# Patient Record
Sex: Male | Born: 1945 | Race: Black or African American | Hispanic: No | Marital: Married | Smoking: Never smoker
Health system: Southern US, Community
[De-identification: ages and names within clinical notes are randomized; demographics above are authoritative.]

## PROBLEM LIST (undated history)

## (undated) DIAGNOSIS — Z8611 Personal history of tuberculosis: Secondary | ICD-10-CM

## (undated) DIAGNOSIS — Z972 Presence of dental prosthetic device (complete) (partial): Secondary | ICD-10-CM

## (undated) DIAGNOSIS — R10A1 Flank pain, right side: Secondary | ICD-10-CM

## (undated) DIAGNOSIS — N200 Calculus of kidney: Secondary | ICD-10-CM

## (undated) DIAGNOSIS — Z973 Presence of spectacles and contact lenses: Secondary | ICD-10-CM

## (undated) DIAGNOSIS — I771 Stricture of artery: Secondary | ICD-10-CM

## (undated) DIAGNOSIS — K08109 Complete loss of teeth, unspecified cause, unspecified class: Secondary | ICD-10-CM

## (undated) DIAGNOSIS — Z87440 Personal history of urinary (tract) infections: Secondary | ICD-10-CM

## (undated) DIAGNOSIS — R300 Dysuria: Secondary | ICD-10-CM

## (undated) DIAGNOSIS — Z87442 Personal history of urinary calculi: Secondary | ICD-10-CM

## (undated) DIAGNOSIS — Z87448 Personal history of other diseases of urinary system: Secondary | ICD-10-CM

## (undated) DIAGNOSIS — I7102 Dissection of abdominal aorta: Secondary | ICD-10-CM

## (undated) DIAGNOSIS — E119 Type 2 diabetes mellitus without complications: Secondary | ICD-10-CM

## (undated) DIAGNOSIS — R109 Unspecified abdominal pain: Secondary | ICD-10-CM

---

## 1998-07-07 ENCOUNTER — Encounter: Payer: Self-pay | Admitting: Pulmonary Disease

## 1998-07-07 ENCOUNTER — Ambulatory Visit (HOSPITAL_COMMUNITY): Admission: RE | Admit: 1998-07-07 | Discharge: 1998-07-07 | Payer: Self-pay | Admitting: Pulmonary Disease

## 1999-05-13 ENCOUNTER — Emergency Department (HOSPITAL_COMMUNITY): Admission: EM | Admit: 1999-05-13 | Discharge: 1999-05-13 | Payer: Self-pay | Admitting: Emergency Medicine

## 2002-01-09 ENCOUNTER — Ambulatory Visit (HOSPITAL_COMMUNITY): Admission: RE | Admit: 2002-01-09 | Discharge: 2002-01-09 | Payer: Self-pay | Admitting: Pulmonary Disease

## 2006-10-07 ENCOUNTER — Emergency Department (HOSPITAL_COMMUNITY): Admission: EM | Admit: 2006-10-07 | Discharge: 2006-10-08 | Payer: Self-pay | Admitting: Emergency Medicine

## 2012-03-06 ENCOUNTER — Ambulatory Visit
Admission: RE | Admit: 2012-03-06 | Discharge: 2012-03-06 | Disposition: A | Discharge status: Home / Self Care | Payer: No Typology Code available for payment source | Source: Ambulatory Visit | Attending: Infectious Diseases | Admitting: Infectious Diseases

## 2012-03-06 ENCOUNTER — Other Ambulatory Visit: Payer: Self-pay | Admitting: Infectious Diseases

## 2012-03-06 DIAGNOSIS — A150 Tuberculosis of lung: Secondary | ICD-10-CM

## 2012-03-06 DIAGNOSIS — R634 Abnormal weight loss: Secondary | ICD-10-CM

## 2012-03-06 DIAGNOSIS — R05 Cough: Secondary | ICD-10-CM

## 2012-03-28 ENCOUNTER — Other Ambulatory Visit: Payer: Self-pay | Admitting: Infectious Diseases

## 2012-03-28 ENCOUNTER — Ambulatory Visit
Admission: RE | Admit: 2012-03-28 | Discharge: 2012-03-28 | Disposition: A | Discharge status: Home / Self Care | Payer: No Typology Code available for payment source | Source: Ambulatory Visit | Attending: Infectious Diseases | Admitting: Infectious Diseases

## 2012-03-28 DIAGNOSIS — Z111 Encounter for screening for respiratory tuberculosis: Secondary | ICD-10-CM

## 2012-09-27 HISTORY — PX: EYE SURGERY: SHX253

## 2015-09-01 ENCOUNTER — Other Ambulatory Visit: Payer: Self-pay | Admitting: Urology

## 2015-09-02 ENCOUNTER — Other Ambulatory Visit (HOSPITAL_COMMUNITY): Payer: Self-pay | Admitting: Urology

## 2015-09-02 DIAGNOSIS — N200 Calculus of kidney: Secondary | ICD-10-CM

## 2015-09-15 ENCOUNTER — Other Ambulatory Visit: Payer: Self-pay | Admitting: Radiology

## 2015-09-15 ENCOUNTER — Encounter (HOSPITAL_COMMUNITY): Payer: Self-pay

## 2015-09-15 ENCOUNTER — Encounter (HOSPITAL_COMMUNITY)
Admission: RE | Admit: 2015-09-15 | Discharge: 2015-09-15 | Disposition: A | Discharge status: Home / Self Care | Payer: No Typology Code available for payment source | Source: Ambulatory Visit | Attending: Urology | Admitting: Urology

## 2015-09-15 DIAGNOSIS — N200 Calculus of kidney: Secondary | ICD-10-CM | POA: Diagnosis not present

## 2015-09-15 DIAGNOSIS — K4020 Bilateral inguinal hernia, without obstruction or gangrene, not specified as recurrent: Secondary | ICD-10-CM | POA: Diagnosis not present

## 2015-09-15 DIAGNOSIS — Z87442 Personal history of urinary calculi: Secondary | ICD-10-CM | POA: Diagnosis not present

## 2015-09-15 DIAGNOSIS — E119 Type 2 diabetes mellitus without complications: Secondary | ICD-10-CM | POA: Diagnosis not present

## 2015-09-15 DIAGNOSIS — Z87891 Personal history of nicotine dependence: Secondary | ICD-10-CM | POA: Diagnosis not present

## 2015-09-15 DIAGNOSIS — Z794 Long term (current) use of insulin: Secondary | ICD-10-CM | POA: Diagnosis not present

## 2015-09-15 DIAGNOSIS — R109 Unspecified abdominal pain: Secondary | ICD-10-CM | POA: Diagnosis present

## 2015-09-15 HISTORY — DX: Personal history of urinary calculi: Z87.442

## 2015-09-15 LAB — BASIC METABOLIC PANEL
Anion gap: 6 (ref 5–15)
BUN: 11 mg/dL (ref 6–20)
CHLORIDE: 105 mmol/L (ref 101–111)
CO2: 29 mmol/L (ref 22–32)
CREATININE: 0.89 mg/dL (ref 0.61–1.24)
Calcium: 9.8 mg/dL (ref 8.9–10.3)
GFR calc non Af Amer: >60 mL/min (ref >60)
Glucose, Bld: 188 mg/dL — ABNORMAL HIGH (ref 65–99)
Potassium: 4.4 mmol/L (ref 3.5–5.1)
Sodium: 140 mmol/L (ref 135–145)

## 2015-09-15 LAB — CBC
HCT: 42.2 % (ref 39.0–52.0)
Hemoglobin: 13.8 g/dL (ref 13.0–17.0)
MCH: 27.7 pg (ref 26.0–34.0)
MCHC: 32.7 g/dL (ref 30.0–36.0)
MCV: 84.6 fL (ref 78.0–100.0)
Platelets: 276 10*3/uL (ref 150–400)
RBC: 4.99 MIL/uL (ref 4.22–5.81)
RDW: 13.5 % (ref 11.5–15.5)
WBC: 4.7 10*3/uL (ref 4.0–10.5)

## 2015-09-15 NOTE — Pre-Procedure Instructions (Signed)
EKG done today.

## 2015-09-15 NOTE — Patient Instructions (Addendum)
21 Arthur Reed  09/15/2015   Your procedure is scheduled on:   09-18-2015 Thursday  Enter through [facility]  Entrance and follow signs to Radiology- check in , then  Proceed to  [facility] to 3rd Floor- Short Stay Center. Arrive at     0730   AM..  (Limit 1 person with you).  Call this number if you have problems the morning of surgery: [phone]  Or Presurgical Testing [phone] days before.   For Living Will and/or Health Care Power Attorney Forms: please provide copy for your medical record,may bring AM of surgery(Forms should be already notarized -we do not provide this service).( No, but  information provided  Today per request).  Remember: Follow any bowel prep instructions per MD office.    Do not eat food/ or drink: After Midnight.      Take these medicines the morning of surgery with A SIP OF WATER-   (DO NOT TAKE ANY DIABETIC MEDS AM OF SURGERY) : Tylenol if need. Lantus( 1/2 usual PM dose night before) - No Insulin or diabetic meds AM of.   Do not wear jewelry, make-up or nail polish.  Do not wear deodorant, lotions, powders, or perfumes.   Do not shave legs and under arms- 48 hours(2 days) prior to first CHG shower.(Shaving face and neck okay.)  Do not bring valuables to the hospital.(Hospital is not responsible for lost valuables).  Contacts, dentures or removable bridgework, body piercing, hair pins may not be worn into surgery.  Leave suitcase in the car. After surgery it may be brought to your room.  For patients admitted to the hospital, checkout time is 11:00 AM the day of discharge.(Restricted visitors-Any Persons displaying flu-like symptoms or illness).    Patients discharged the day of surgery will not be allowed to drive home. Must have responsible person with you x 24 hours once discharged.  Name and phone number of your driver:  Arnetta-spouse [phone] cell     Please read over the following fact sheets that you were given:   CHG(Chlorhexidine Gluconate 4% Surgical Soap) use.  Remember : Type/Screen "Blue armbands" - may not be removed once applied(would result in being retested AM of surgery, if removed).         [facility] - Preparing for Surgery Before surgery, you can play an important role.  Because skin is not sterile, your skin needs to be as free of germs as possible.  You can reduce the number of germs on your skin by washing with CHG (chlorahexidine gluconate) soap before surgery.  CHG is an antiseptic cleaner which kills germs and bonds with the skin to continue killing germs even after washing. Please DO NOT use if you have an allergy to CHG or antibacterial soaps.  If your skin becomes reddened/irritated stop using the CHG and inform your nurse when you arrive at Short Stay. Do not shave (including legs and underarms) for at least 48 hours prior to the first CHG shower.  You may shave your face/neck. Please follow these instructions carefully:  1.  Shower with CHG Soap the night before surgery and the  morning of Surgery.  2.  If you choose to wash your hair, wash your hair first as usual with your  normal  shampoo.  3.  After you shampoo, rinse your hair and body thoroughly to remove the  shampoo.  4.  Use CHG as you would any other liquid soap.  You can apply chg directly  to the skin and wash                       Gently with a scrungie or clean washcloth.  5.  Apply the CHG Soap to your body ONLY FROM THE NECK DOWN.   Do not use on face/ open                           Wound or open sores. Avoid contact with eyes, ears mouth and genitals (private parts).                       Wash face,  Genitals (private parts) with your normal soap.             6.  Wash thoroughly, paying special attention to the area where your surgery  will be performed.  7.  Thoroughly rinse your body with warm water from the neck down.  8.  DO NOT shower/wash with your normal soap after using and  rinsing off  the CHG Soap.                9.  Pat yourself dry with a clean towel.            10.  Wear clean pajamas.            11.  Place clean sheets on your bed the night of your first shower and do not  sleep with pets. Day of Surgery : Do not apply any lotions/deodorants the morning of surgery.  Please wear clean clothes to the hospital/surgery center.  FAILURE TO FOLLOW THESE INSTRUCTIONS MAY RESULT IN THE CANCELLATION OF YOUR SURGERY PATIENT SIGNATURE_________________________________  NURSE SIGNATURE__________________________________  ________________________________________________________________________

## 2015-09-16 LAB — HEMOGLOBIN A1C
HEMOGLOBIN A1C: 7.9 % — AB (ref 4.8–5.6)
MEAN PLASMA GLUCOSE: 180 mg/dL

## 2015-09-16 NOTE — Progress Notes (Signed)
09-16-15 1255 Hgb A1C level noted in Epic, please review.

## 2015-09-17 ENCOUNTER — Other Ambulatory Visit: Payer: Self-pay | Admitting: Radiology

## 2015-09-18 ENCOUNTER — Ambulatory Visit (HOSPITAL_COMMUNITY): Payer: No Typology Code available for payment source

## 2015-09-18 ENCOUNTER — Ambulatory Visit (HOSPITAL_COMMUNITY)
Admission: RE | Admit: 2015-09-18 | Discharge: 2015-09-19 | Disposition: A | Discharge status: Home / Self Care | Payer: No Typology Code available for payment source | Source: Ambulatory Visit | Attending: Urology | Admitting: Urology

## 2015-09-18 ENCOUNTER — Encounter (HOSPITAL_COMMUNITY)
Admission: RE | Disposition: A | Discharge status: Home / Self Care | Payer: Self-pay | Source: Ambulatory Visit | Attending: Urology

## 2015-09-18 ENCOUNTER — Ambulatory Visit (HOSPITAL_COMMUNITY)
Admission: RE | Admit: 2015-09-18 | Discharge: 2015-09-18 | Disposition: A | Discharge status: Home / Self Care | Payer: Self-pay | Source: Ambulatory Visit | Attending: Urology | Admitting: Urology

## 2015-09-18 ENCOUNTER — Ambulatory Visit (HOSPITAL_COMMUNITY)
Admission: RE | Admit: 2015-09-18 | Discharge: 2015-09-18 | Disposition: A | Discharge status: Home / Self Care | Payer: Non-veteran care | Source: Ambulatory Visit | Attending: Urology | Admitting: Urology

## 2015-09-18 ENCOUNTER — Ambulatory Visit (HOSPITAL_COMMUNITY): Payer: No Typology Code available for payment source | Admitting: Anesthesiology

## 2015-09-18 ENCOUNTER — Encounter (HOSPITAL_COMMUNITY): Payer: Self-pay | Admitting: *Deleted

## 2015-09-18 DIAGNOSIS — Z87442 Personal history of urinary calculi: Secondary | ICD-10-CM | POA: Insufficient documentation

## 2015-09-18 DIAGNOSIS — Z87891 Personal history of nicotine dependence: Secondary | ICD-10-CM | POA: Insufficient documentation

## 2015-09-18 DIAGNOSIS — Z794 Long term (current) use of insulin: Secondary | ICD-10-CM | POA: Insufficient documentation

## 2015-09-18 DIAGNOSIS — E119 Type 2 diabetes mellitus without complications: Secondary | ICD-10-CM | POA: Diagnosis not present

## 2015-09-18 DIAGNOSIS — N200 Calculus of kidney: Secondary | ICD-10-CM | POA: Diagnosis not present

## 2015-09-18 DIAGNOSIS — K4020 Bilateral inguinal hernia, without obstruction or gangrene, not specified as recurrent: Secondary | ICD-10-CM | POA: Diagnosis not present

## 2015-09-18 HISTORY — PX: NEPHROLITHOTOMY: SHX5134

## 2015-09-18 LAB — CBC WITH DIFFERENTIAL/PLATELET
BASOS PCT: 0 %
Basophils Absolute: 0 10*3/uL (ref 0.0–0.1)
Eosinophils Absolute: 0.1 10*3/uL (ref 0.0–0.7)
Eosinophils Relative: 2 %
HEMATOCRIT: 39.5 % (ref 39.0–52.0)
HEMOGLOBIN: 13 g/dL (ref 13.0–17.0)
LYMPHS ABS: 1.9 10*3/uL (ref 0.7–4.0)
Lymphocytes Relative: 44 %
MCH: 27.8 pg (ref 26.0–34.0)
MCHC: 32.9 g/dL (ref 30.0–36.0)
MCV: 84.6 fL (ref 78.0–100.0)
MONO ABS: 0.3 10*3/uL (ref 0.1–1.0)
MONOS PCT: 7 %
NEUTROS ABS: 2 10*3/uL (ref 1.7–7.7)
Neutrophils Relative %: 47 %
Platelets: 253 10*3/uL (ref 150–400)
RBC: 4.67 MIL/uL (ref 4.22–5.81)
RDW: 13.6 % (ref 11.5–15.5)
WBC: 4.3 10*3/uL (ref 4.0–10.5)

## 2015-09-18 LAB — BASIC METABOLIC PANEL
ANION GAP: 8 (ref 5–15)
BUN: 20 mg/dL (ref 6–20)
CHLORIDE: 105 mmol/L (ref 101–111)
CO2: 27 mmol/L (ref 22–32)
CREATININE: 0.98 mg/dL (ref 0.61–1.24)
Calcium: 9.6 mg/dL (ref 8.9–10.3)
GFR calc Af Amer: >60 mL/min (ref >60)
GFR calc non Af Amer: >60 mL/min (ref >60)
GLUCOSE: 192 mg/dL — AB (ref 65–99)
Potassium: 4.1 mmol/L (ref 3.5–5.1)
Sodium: 140 mmol/L (ref 135–145)

## 2015-09-18 LAB — PROTIME-INR
INR: 1.04 (ref 0.00–1.49)
Prothrombin Time: 13.8 seconds (ref 11.6–15.2)

## 2015-09-18 LAB — GLUCOSE, CAPILLARY
Glucose-Capillary: 177 mg/dL — ABNORMAL HIGH (ref 65–99)
Glucose-Capillary: 173 mg/dL — ABNORMAL HIGH (ref 65–99)

## 2015-09-18 LAB — TYPE AND SCREEN
ABO/RH(D): B POS
ANTIBODY SCREEN: NEGATIVE

## 2015-09-18 LAB — ABO/RH: ABO/RH(D): B POS

## 2015-09-18 LAB — HEMOGLOBIN AND HEMATOCRIT, BLOOD
HCT: 37.6 % — ABNORMAL LOW (ref 39.0–52.0)
Hemoglobin: 12.3 g/dL — ABNORMAL LOW (ref 13.0–17.0)

## 2015-09-18 SURGERY — NEPHROLITHOTOMY PERCUTANEOUS
Anesthesia: General | Site: Flank | Laterality: Right

## 2015-09-18 MED ORDER — CEFAZOLIN SODIUM-DEXTROSE 2-3 GM-% IV SOLR
2.0000 g | INTRAVENOUS | Status: AC
  Administered 2015-09-18: 2 g via INTRAVENOUS

## 2015-09-18 MED ORDER — BELLADONNA ALKALOIDS-OPIUM 16.2-60 MG RE SUPP
1.0000 | Freq: Four times a day (QID) | RECTAL | Status: DC | PRN
  Administered 2015-09-18: 1 via RECTAL
  Filled 2015-09-18: qty 1

## 2015-09-18 MED ORDER — SODIUM CHLORIDE 0.9 % IJ SOLN
INTRAMUSCULAR | Status: AC
  Filled 2015-09-18: qty 10

## 2015-09-18 MED ORDER — ONDANSETRON HCL 4 MG/2ML IJ SOLN
INTRAMUSCULAR | Status: DC | PRN
  Administered 2015-09-18: 4 mg via INTRAVENOUS

## 2015-09-18 MED ORDER — SUGAMMADEX SODIUM 200 MG/2ML IV SOLN
INTRAVENOUS | Status: DC | PRN
  Administered 2015-09-18: 200 mg via INTRAVENOUS

## 2015-09-18 MED ORDER — SUCCINYLCHOLINE CHLORIDE 20 MG/ML IJ SOLN
INTRAMUSCULAR | Status: DC | PRN
  Administered 2015-09-18: 100 mg via INTRAVENOUS

## 2015-09-18 MED ORDER — LIDOCAINE HCL (CARDIAC) 20 MG/ML IV SOLN
INTRAVENOUS | Status: DC | PRN
  Administered 2015-09-18: 100 mg via INTRAVENOUS

## 2015-09-18 MED ORDER — SODIUM CHLORIDE 0.9 % IV SOLN: Freq: Once | INTRAVENOUS | Status: AC

## 2015-09-18 MED ORDER — SODIUM CHLORIDE 0.9 % IV SOLN
INTRAVENOUS | Status: DC
  Administered 2015-09-18: 1000 mL via INTRAVENOUS

## 2015-09-18 MED ORDER — LIDOCAINE HCL (CARDIAC) 20 MG/ML IV SOLN
INTRAVENOUS | Status: AC
  Filled 2015-09-18: qty 5

## 2015-09-18 MED ORDER — SODIUM CHLORIDE 0.9 % IV SOLN
INTRAVENOUS | Status: DC
  Administered 2015-09-18 – 2015-09-19 (×2): via INTRAVENOUS

## 2015-09-18 MED ORDER — CEFAZOLIN SODIUM-DEXTROSE 2-3 GM-% IV SOLR
2.0000 g | Freq: Three times a day (TID) | INTRAVENOUS | Status: AC
  Administered 2015-09-18 – 2015-09-19 (×2): 2 g via INTRAVENOUS
  Filled 2015-09-18 (×2): qty 50

## 2015-09-18 MED ORDER — FENTANYL CITRATE (PF) 100 MCG/2ML IJ SOLN
25.0000 ug | INTRAMUSCULAR | Status: DC | PRN
  Administered 2015-09-18 (×2): 25 ug via INTRAVENOUS

## 2015-09-18 MED ORDER — ONDANSETRON HCL 4 MG/2ML IJ SOLN
INTRAMUSCULAR | Status: AC
  Filled 2015-09-18: qty 2

## 2015-09-18 MED ORDER — LIDOCAINE HCL 1 % IJ SOLN
INTRAMUSCULAR | Status: AC
  Filled 2015-09-18: qty 20

## 2015-09-18 MED ORDER — DEXAMETHASONE SODIUM PHOSPHATE 10 MG/ML IJ SOLN
INTRAMUSCULAR | Status: AC
  Filled 2015-09-18: qty 1

## 2015-09-18 MED ORDER — IOHEXOL 300 MG/ML  SOLN
INTRAMUSCULAR | Status: DC | PRN
  Administered 2015-09-18: 4 mL via URETHRAL

## 2015-09-18 MED ORDER — PROPOFOL 10 MG/ML IV BOLUS
INTRAVENOUS | Status: AC
  Filled 2015-09-18: qty 20

## 2015-09-18 MED ORDER — ROCURONIUM BROMIDE 100 MG/10ML IV SOLN
INTRAVENOUS | Status: AC
  Filled 2015-09-18: qty 1

## 2015-09-18 MED ORDER — CEFAZOLIN SODIUM-DEXTROSE 2-3 GM-% IV SOLR
INTRAVENOUS | Status: AC
  Filled 2015-09-18: qty 50

## 2015-09-18 MED ORDER — SUFENTANIL CITRATE 50 MCG/ML IV SOLN
INTRAVENOUS | Status: AC
  Filled 2015-09-18: qty 1

## 2015-09-18 MED ORDER — PROPOFOL 10 MG/ML IV BOLUS
INTRAVENOUS | Status: DC | PRN
  Administered 2015-09-18: 150 mg via INTRAVENOUS

## 2015-09-18 MED ORDER — ONDANSETRON HCL 4 MG/2ML IJ SOLN: 4.0000 mg | INTRAMUSCULAR | Status: DC | PRN

## 2015-09-18 MED ORDER — HYDROMORPHONE HCL 1 MG/ML IJ SOLN
0.5000 mg | INTRAMUSCULAR | Status: DC | PRN
  Administered 2015-09-18: 0.5 mg via INTRAVENOUS
  Filled 2015-09-18: qty 1

## 2015-09-18 MED ORDER — SUFENTANIL CITRATE 50 MCG/ML IV SOLN
INTRAVENOUS | Status: DC | PRN
  Administered 2015-09-18 (×4): 5 ug via INTRAVENOUS

## 2015-09-18 MED ORDER — SODIUM CHLORIDE 0.9 % IR SOLN
Status: DC | PRN
  Administered 2015-09-18: 18000 mL

## 2015-09-18 MED ORDER — FENTANYL CITRATE (PF) 100 MCG/2ML IJ SOLN
INTRAMUSCULAR | Status: AC
  Filled 2015-09-18: qty 2

## 2015-09-18 MED ORDER — MIDAZOLAM HCL 2 MG/2ML IJ SOLN
INTRAMUSCULAR | Status: AC
  Filled 2015-09-18: qty 6

## 2015-09-18 MED ORDER — ACETAMINOPHEN 325 MG PO TABS: 650.0000 mg | ORAL_TABLET | ORAL | Status: DC | PRN

## 2015-09-18 MED ORDER — MIDAZOLAM HCL 2 MG/2ML IJ SOLN
INTRAMUSCULAR | Status: AC | PRN
  Administered 2015-09-18 (×5): 0.5 mg via INTRAVENOUS
  Administered 2015-09-18: 1 mg via INTRAVENOUS
  Administered 2015-09-18 (×3): 0.5 mg via INTRAVENOUS

## 2015-09-18 MED ORDER — MIDAZOLAM HCL 2 MG/2ML IJ SOLN
INTRAMUSCULAR | Status: AC
  Filled 2015-09-18: qty 2

## 2015-09-18 MED ORDER — ROCURONIUM BROMIDE 100 MG/10ML IV SOLN
INTRAVENOUS | Status: DC | PRN
  Administered 2015-09-18: 10 mg via INTRAVENOUS
  Administered 2015-09-18: 30 mg via INTRAVENOUS

## 2015-09-18 MED ORDER — DEXAMETHASONE SODIUM PHOSPHATE 10 MG/ML IJ SOLN
INTRAMUSCULAR | Status: DC | PRN
  Administered 2015-09-18: 10 mg via INTRAVENOUS

## 2015-09-18 MED ORDER — SUGAMMADEX SODIUM 200 MG/2ML IV SOLN
INTRAVENOUS | Status: AC
  Filled 2015-09-18: qty 2

## 2015-09-18 MED ORDER — EPHEDRINE SULFATE 50 MG/ML IJ SOLN
INTRAMUSCULAR | Status: AC
  Filled 2015-09-18: qty 1

## 2015-09-18 MED ORDER — FENTANYL CITRATE (PF) 100 MCG/2ML IJ SOLN
INTRAMUSCULAR | Status: AC | PRN
  Administered 2015-09-18 (×3): 25 ug via INTRAVENOUS
  Administered 2015-09-18: 50 ug via INTRAVENOUS
  Administered 2015-09-18: 25 ug via INTRAVENOUS

## 2015-09-18 MED ORDER — ONDANSETRON HCL 4 MG/2ML IJ SOLN: 4.0000 mg | Freq: Once | INTRAMUSCULAR | Status: DC | PRN

## 2015-09-18 MED ORDER — IOHEXOL 300 MG/ML  SOLN
100.0000 mL | Freq: Once | INTRAMUSCULAR | Status: AC | PRN
Start: 2015-09-18 — End: 2015-09-18
  Administered 2015-09-18: 100 mL

## 2015-09-18 MED ORDER — FENTANYL CITRATE (PF) 100 MCG/2ML IJ SOLN
INTRAMUSCULAR | Status: AC
  Filled 2015-09-18: qty 4

## 2015-09-18 MED ORDER — LACTATED RINGERS IV SOLN
INTRAVENOUS | Status: DC | PRN
  Administered 2015-09-18 (×3): via INTRAVENOUS

## 2015-09-18 MED ORDER — OXYCODONE HCL 5 MG PO TABS
5.0000 mg | ORAL_TABLET | ORAL | Status: DC | PRN
  Administered 2015-09-19 (×2): 5 mg via ORAL
  Filled 2015-09-18 (×2): qty 1

## 2015-09-18 SURGICAL SUPPLY — 56 items
APL ESCP 34 STRL LF DISP (HEMOSTASIS)
APL SKNCLS STERI-STRIP NONHPOA (GAUZE/BANDAGES/DRESSINGS) ×2
APPLICATOR SURGIFLO ENDO (HEMOSTASIS) IMPLANT
BAG URINE DRAINAGE (UROLOGICAL SUPPLIES) IMPLANT
BASKET ZERO TIP NITINOL 2.4FR (BASKET) IMPLANT
BENZOIN TINCTURE PRP APPL 2/3 (GAUZE/BANDAGES/DRESSINGS) ×4 IMPLANT
BLADE SURG 15 STRL LF DISP TIS (BLADE) ×2 IMPLANT
BLADE SURG 15 STRL SS (BLADE) ×4
BSKT STON RTRVL ZERO TP 2.4FR (BASKET)
CATCHER STONE W/TUBE ADAPTER (UROLOGICAL SUPPLIES) IMPLANT
CATH FOLEY 2W COUNCIL 20FR 5CC (CATHETERS) ×3 IMPLANT
CATH FOLEY 2WAY SLVR  5CC 18FR (CATHETERS) ×2
CATH FOLEY 2WAY SLVR 5CC 18FR (CATHETERS) ×1 IMPLANT
CATH IMAGER II 65CM (CATHETERS) IMPLANT
CATH X-FORCE N30 NEPHROSTOMY (TUBING) ×4 IMPLANT
COVER SURGICAL LIGHT HANDLE (MISCELLANEOUS) ×4 IMPLANT
DRAPE C-ARM 42X120 X-RAY (DRAPES) ×4 IMPLANT
DRAPE LINGEMAN PERC (DRAPES) ×4 IMPLANT
DRAPE SURG IRRIG POUCH 19X23 (DRAPES) ×4 IMPLANT
DRSG PAD ABDOMINAL 8X10 ST (GAUZE/BANDAGES/DRESSINGS) ×8 IMPLANT
DRSG TEGADERM 8X12 (GAUZE/BANDAGES/DRESSINGS) ×6 IMPLANT
FIBER LASER FLEXIVA 1000 (UROLOGICAL SUPPLIES) IMPLANT
FIBER LASER FLEXIVA 200 (UROLOGICAL SUPPLIES) IMPLANT
FIBER LASER FLEXIVA 365 (UROLOGICAL SUPPLIES) IMPLANT
FIBER LASER FLEXIVA 550 (UROLOGICAL SUPPLIES) IMPLANT
FIBER LASER TRAC TIP (UROLOGICAL SUPPLIES) IMPLANT
FLOSEAL 10ML (HEMOSTASIS) IMPLANT
GAUZE SPONGE 4X4 12PLY STRL (GAUZE/BANDAGES/DRESSINGS) ×4 IMPLANT
GLOVE BIOGEL M 8.0 STRL (GLOVE) ×4 IMPLANT
GOWN STRL REUS W/TWL XL LVL3 (GOWN DISPOSABLE) ×4 IMPLANT
GUIDEWIRE AMPLAZ .035X145 (WIRE) ×8 IMPLANT
GUIDEWIRE STR DUAL SENSOR (WIRE) IMPLANT
HOLDER FOLEY CATH W/STRAP (MISCELLANEOUS) ×4 IMPLANT
KIT BASIN OR (CUSTOM PROCEDURE TRAY) ×4 IMPLANT
MANIFOLD NEPTUNE II (INSTRUMENTS) ×4 IMPLANT
NS IRRIG 1000ML POUR BTL (IV SOLUTION) ×4 IMPLANT
PACK CYSTO (CUSTOM PROCEDURE TRAY) ×4 IMPLANT
PROBE LITHOCLAST ULTRA 3.8X403 (UROLOGICAL SUPPLIES) IMPLANT
PROBE PNEUMATIC 1.0MMX570MM (UROLOGICAL SUPPLIES) IMPLANT
SET IRRIG Y TYPE TUR BLADDER L (SET/KITS/TRAYS/PACK) ×4 IMPLANT
SET WARMING FLUID IRRIGATION (MISCELLANEOUS) IMPLANT
SHEATH PEELAWAY SET 9 (SHEATH) ×4 IMPLANT
STENT URET 6FRX24 CONTOUR (STENTS) ×3 IMPLANT
STONE CATCHER W/TUBE ADAPTER (UROLOGICAL SUPPLIES) IMPLANT
SURGIFLO W/THROMBIN 8M KIT (HEMOSTASIS) IMPLANT
SUT MNCRL AB 4-0 PS2 18 (SUTURE) IMPLANT
SUT SILK 2 0 30  PSL (SUTURE)
SUT SILK 2 0 30 PSL (SUTURE) IMPLANT
SUT SILK 2 0 SH (SUTURE) ×3 IMPLANT
SYR 20CC LL (SYRINGE) ×8 IMPLANT
SYRINGE 10CC LL (SYRINGE) ×4 IMPLANT
TOWEL OR NON WOVEN STRL DISP B (DISPOSABLE) ×4 IMPLANT
TRAY FOLEY W/METER SILVER 14FR (SET/KITS/TRAYS/PACK) IMPLANT
TRAY FOLEY W/METER SILVER 16FR (SET/KITS/TRAYS/PACK) IMPLANT
TUBING CONNECTING 10 (TUBING) ×6 IMPLANT
TUBING CONNECTING 10' (TUBING) ×2

## 2015-09-18 NOTE — H&P (Signed)
Chief Complaint: Patient was seen in consultation today for right nephroureteral catheter placement  Referring Physician(s): Ottelin,Mark  History of Present Illness: Arthur Reed is a 69 y.o. male past medical history significant for diabetes, previously treated tuberculosis, and nephrolithiasis. Patient has history of chronic right CVA/right groin discomfort and latest KUB reveals a large stone in the lower pole the right kidney measuring 2.6 x 1.4 cm with a smaller stone just cephalad to this. He presents today for right nephroureteral catheter placement prior to nephrolithotomy.  Past Medical History  Diagnosis Date  . Diabetes mellitus without complication (HCC)   . History of kidney stones     kidney stones x2  . Tuberculosis     tx. for 6 months -no reason given for this.Don't know if Test was Positive or not.  . Complication of anesthesia     12/22 patient states never had problems with anesthesia    Past Surgical History  Procedure Laterality Date  . Eye surgery      laser for eye glaucoma    Allergies: Review of patient's allergies indicates no known allergies.  Medications: Prior to Admission medications   Medication Sig Start Date End Date Taking? Authorizing Provider  acetaminophen (TYLENOL) 325 MG tablet Take 975 mg by mouth 2 (two) times daily as needed for mild pain or moderate pain.    Historical Provider, MD  HYDROcodone-acetaminophen (NORCO/VICODIN) 5-325 MG tablet Take 1-2 tablets by mouth 2 (two) times daily as needed for moderate pain or severe pain.    Historical Provider, MD  ibuprofen (ADVIL,MOTRIN) 600 MG tablet Take 600 mg by mouth every 6 (six) hours as needed for mild pain.    Historical Provider, MD  insulin aspart (NOVOLOG) 100 UNIT/ML injection Inject 8 Units into the skin 3 (three) times daily before meals.    Historical Provider, MD  insulin glargine (LANTUS) 100 UNIT/ML injection Inject 15 Units into the skin at bedtime.     Historical Provider, MD     No family history on file.  Social History   Social History  . Marital Status: Married    Spouse Name: N/A  . Number of Children: N/A  . Years of Education: N/A   Social History Main Topics  . Smoking status: Never Smoker   . Smokeless tobacco: Not on file  . Alcohol Use: 1.2 - 1.8 oz/week    1-2 Cans of beer, 1 Shots of liquor per week     Comment: beer or 1 drink nightly  . Drug Use: No  . Sexual Activity: Not on file   Other Topics Concern  . Not on file   Social History Narrative     Review of Systems  Constitutional: Negative for fever and chills.  Respiratory: Negative for cough and shortness of breath.   Cardiovascular: Negative for chest pain.  Gastrointestinal: Negative for nausea, vomiting, abdominal pain and blood in stool.  Genitourinary: Positive for flank pain. Negative for dysuria and hematuria.  Musculoskeletal: Positive for back pain.  Neurological: Negative for headaches.    Vital Signs: Blood pressure 154/82, temperature 98, heart rate 81, respirations 18, oxygen saturation is 100% room air   Physical Exam  Constitutional: He is oriented to person, place, and time. He appears well-developed and well-nourished.  Cardiovascular: Normal rate and regular rhythm.   Pulmonary/Chest: Effort normal.  Distant breath sounds bilaterally.  Abdominal: Soft. Bowel sounds are normal.  Mild right CVA tenderness; radiation of discomfort to right groin; bilat inguinal hernias  Musculoskeletal: He exhibits no edema.  Neurological: He is alert and oriented to person, place, and time.    Mallampati Score:     Imaging: No results found.  Labs:  CBC:  Recent Labs  09/15/15 1115 09/18/15 0805  WBC 4.7 4.3  HGB 13.8 13.0  HCT 42.2 39.5  PLT 276 253    COAGS:  Recent Labs  09/18/15 0805  INR 1.04    BMP:  Recent Labs  09/15/15 1115 09/18/15 0805  NA 140 140  K 4.4 4.1  CL 105 105  CO2 29 27  GLUCOSE 188*  192*  BUN 11 20  CALCIUM 9.8 9.6  CREATININE 0.89 0.98  GFRNONAA >60 >60  GFRAA >60 >60    LIVER FUNCTION TESTS: No results for input(s): BILITOT, AST, ALT, ALKPHOS, PROT, ALBUMIN in the last 8760 hours.  TUMOR MARKERS: No results for input(s): AFPTM, CEA, CA199, CHROMGRNA in the last 8760 hours.  Assessment and Plan: Patient with history of diabetes and symptomatic nephrolithiasis with largest stone burden in lower pole of right kidney. Plan today is for right nephroureteral catheter placement prior to nephrolithotomy. Risks and benefits discussed with the patient/wife including, but not limited to infection, bleeding, significant bleeding causing loss or decrease in renal function or damage to adjacent structures. All of the patient's questions were answered, patient is agreeable to proceed.Consent signed and in chart.     Thank you for this interesting consult.  I greatly enjoyed meeting [facility] and look forward to participating in their care.  A copy of this report was sent to the requesting provider on this date.  Signed: D. Jeananne Rama 09/18/2015, 8:51 AM   I spent a total of 15 minutes in face to face in clinical consultation, greater than 50% of which was counseling/coordinating care for right nephroureteral catheter placement

## 2015-09-18 NOTE — Transfer of Care (Signed)
Immediate Anesthesia Transfer of Care Note  Patient: Arthur HawthorneClarence Blackley  Procedure(s) Performed: Procedure(s): RIGHT PERCUTANEOUS NEPHROLITHOTOMY  (Right)  Patient Location: PACU  Anesthesia Type:General  Level of Consciousness: awake, pateint uncooperative, confused, lethargic and responds to stimulation  Airway & Oxygen Therapy: Patient Spontanous Breathing and Patient connected to face mask oxygen  Post-op Assessment: Report given to RN, Post -op Vital signs reviewed and stable and Patient moving all extremities X 4  Post vital signs: Reviewed and stable  Last Vitals:  Filed Vitals:   09/18/15 0733  BP: 154/82  Pulse: 81  Temp: 36.7 C  Resp: 18    Complications: No apparent anesthesia complications

## 2015-09-18 NOTE — H&P (Signed)
Reason For Visit Mr. Krichbaum is a 69 year old male who was self-referred for abdominal pain and kidney stone.   History of Present Illness Calculus disease: I saw him initially in 1/08 at which time he was passing right ureteral stone and a CT scan at that time revealed bilateral renal calculi with the largest measuring 10 x 14 mm located in the lower pole right kidney.     CT scan 03/03/15 revealed bilateral, nonobstructing renal calculi as well as a 1.4 cm stone seen within the right renal pelvis. According to the radiologist the stone burden had not changed since 9/14.    Interval history: The patient had reported having right-sided pelvic pain for several years radiating to the right groin region. It appears that he was found to have a 1.6 cm stone in the right renal pelvis and initially scheduled for ESWL but did not have this performed due to a lack of travel pain. He was then seen and scheduled for a right PCNL in 2010 but for some reason did not have this performed. In 6/16 a CT scan at the [facility] revealed bilateral nonobstructing stones which had not changed from a previous CT scan done in 8/14 with what was described as a 1.4 cm right renal pelvic stone. He was having inguinal pain at that time and was found to have bilateral inguinal hernias. I then saw what appears to be a posting sheet to schedule him for a right PCNL in 9/16 at the [place].  He reports to me that his chief complaint is that of pain in his lower abdomen on the right hand side. He has not had his hernias repaired.   Past Medical History Problems  1. History of diabetes mellitus (Z86.39)  Surgical History Problems  1. History of No Surgical Problems  Current Meds 1. Lantus 100 UNIT/ML Subcutaneous Solution;  Therapy: (Recorded:01Dec2016) to Recorded 2. NovoLOG FlexPen 100 UNIT/ML SOLN;  Therapy: (Recorded:01Dec2016) to Recorded  Allergies Medication  1. No Known Drug Allergies  Family History Problems  1.  Family history of Family Health Status Number Of Children   3 sons 2. Family history of Alzheimer's disease (Z82.0) : Mother 3. Family history of diabetes mellitus (Z83.3) : Father  Social History Problems    Alcohol use (Z78.9)   Caffeine use (F15.90)   Father deceased   Former smoker (Z87.891)   Married   Mother deceased   Number of children  Review of Systems Genitourinary, constitutional, skin, eye, otolaryngeal, hematologic/lymphatic, cardiovascular, pulmonary, endocrine, musculoskeletal, gastrointestinal, neurological and psychiatric system(s) were reviewed and pertinent findings if present are noted and are otherwise negative.  Genitourinary: urinary frequency, dysuria, nocturia, incontinence and urinary stream starts and stops.  Respiratory: shortness of breath.  Musculoskeletal: back pain.    Vitals Vital Signs  Height: 5 ft 8 in Weight: 152 lb  BMI Calculated: 23.11 BSA Calculated: 1.82 Blood Pressure: 136 / 70 Heart Rate: 82  Physical Exam Constitutional: Well nourished and well developed . No acute distress.  ENT:. The ears and nose are normal in appearance.  Neck: The appearance of the neck is normal and no neck mass is present.  Pulmonary: No respiratory distress and normal respiratory rhythm and effort.  Cardiovascular: Heart rate and rhythm are normal . No peripheral edema.  Abdomen: The abdomen is soft and nontender. No masses are palpated. No CVA tenderness. No hernias are palpable. No hepatosplenomegaly noted.  Rectal: Rectal exam demonstrates normal sphincter tone, no tenderness and no masses. The prostate has  no nodularity and is not tender. The left seminal vesicle is nonpalpable. The right seminal vesicle is nonpalpable. The perineum is normal on inspection.  Genitourinary: Examination of the penis demonstrates no discharge, no masses, no lesions and a normal meatus. The scrotum is without lesions. The right epididymis is palpably normal and  non-tender. The left epididymis is palpably normal and non-tender. The right testis is non-tender and without masses. The left testis is non-tender and without masses.  Lymphatics: The femoral and inguinal nodes are not enlarged or tender.  Skin: Normal skin turgor, no visible rash and no visible skin lesions.  Neuro/Psych:. Mood and affect are appropriate.    Results/Data Urine  COLOR YELLOW  APPEARANCE CLEAR  SPECIFIC GRAVITY 1.025  pH 5.5  GLUCOSE 2+  BILIRUBIN NEGATIVE  KETONE NEGATIVE  BLOOD 2+  PROTEIN NEGATIVE  NITRITE NEGATIVE  LEUKOCYTE ESTERASE 1+  SQUAMOUS EPITHELIAL/HPF 0-5 HPF WBC 6-10 WBC/HPF RBC 3-10 RBC/HPF BACTERIA NONE SEEN HPF CRYSTALS NONE SEEN HPF CASTS See Below LPF Other   Yeast NONE SEEN HPF  [facility] records or history [facility].  The following images/tracing/specimen were independently visualized:  CT scan as above.  The following clinical lab reports were reviewed:  UA: Red and white cells were noted in his urine today.  The following radiology reports were reviewed: CT scan.    Assessment Assessed  1. Renal calculus, bilateral (N20.0)  I obtained a KUB today and it reveals the largest stone that appears to be in the area of the lower pole of his right kidney measures 2.6 x 1.4 cm. There also appears to be a smaller stone just cephalad to this large stone as well.       We discussed the management of urinary stones. These options include observation, ureteroscopy, shockwave lithotripsy, and PCNL. We discussed which options are relevant to these particular stones. We discussed the natural history of stones as well as the complications of untreated stones and the impact on quality of life without treatment as well as with each of the above listed treatments. We also discussed the efficacy of each treatment in its ability to clear the stone burden. With any of these management options I discussed the signs and symptoms of infection and  the need for emergent treatment should these be experienced. For each option we discussed the ability of each procedure to clear the patient of their stone burden.    For observation I described the risks which include but are not limited to silent renal damage, life-threatening infection, need for emergent surgery, failure to pass stone, and pain.    For ureteroscopy I described the risks which include heart attack, stroke, pulmonary embolus, death, bleeding, infection, damage to contiguous structures, positioning injury, ureteral stricture, ureteral avulsion, ureteral injury, need for ureteral stent, inability to perform ureteroscopy, need for an interval procedure, inability to clear stone burden, stent discomfort and pain.    For shockwave lithotripsy I described the risks which include arrhythmia, kidney contusion, kidney hemorrhage, need for transfusion, long-term risk of diabetes or hypertension, back discomfort, flank ecchymosis, flank abrasion, inability to break up stone, inability to pass stone fragments, Steinstrasse, infection associated with obstructing stones, need for different surgical procedure and possible need for repeat shockwave lithotripsy.    For PCNL I described the risks including heart attack, sure, pulmonary embolus, death, positioning injury, pneumothorax, hydrothorax, need for chest tube, inability to clear stone burden, renal laceration, arterial venous fistula or malformation, need for embolization of  kidney, loss of kidney or renal function, need for repeat procedure, need for prolonged nephrostomy tube, ureteral avulsion and fistula.    Due to the large size of the stone I do not feel lithotripsy would be an appropriate form of therapy and ureteroscopy is also not indicated. We therefore discussed proceeding with a PCNL. He understands that this has a potential be a staged procedure due to the large stone volume. He understands and is elected to proceed. He also  understands that the pain in his abdomen, while may be from his stone, may not resolve as it is somewhat atypical of a nonobstructing renal calculus.   Plan   1. He will be scheduled for a right PCNL.  2.  Preoperative urine culture was negative.

## 2015-09-18 NOTE — Discharge Instructions (Signed)

## 2015-09-18 NOTE — Anesthesia Procedure Notes (Signed)
Procedure Name: Intubation Date/Time: 09/18/2015 1:18 PM Performed by: Leroy LibmanEARDON, Arthur Abram L Patient Re-evaluated:Patient Re-evaluated prior to inductionOxygen Delivery Method: Circle system utilized Preoxygenation: Pre-oxygenation with 100% oxygen Intubation Type: IV induction Ventilation: Mask ventilation without difficulty and Oral airway inserted - appropriate to patient size Laryngoscope Size: Miller and 3 Grade View: Grade I Tube type: Oral Tube size: 8.0 mm Number of attempts: 1 Airway Equipment and Method: Stylet Placement Confirmation: ETT inserted through vocal cords under direct vision,  positive ETCO2 and breath sounds checked- equal and bilateral Secured at: 22 cm Tube secured with: Tape Dental Injury: Teeth and Oropharynx as per pre-operative assessment

## 2015-09-18 NOTE — Op Note (Addendum)
PATIENT:  Arthur Hawthornelarence Housh  PRE-OPERATIVE DIAGNOSIS: Right renal calculi  POST-OPERATIVE DIAGNOSIS: Same  PROCEDURE: 1. Removal of previous percutaneous nephroureteral catheter  2. Percutaneous nephrostomy sheath placement. 3.  Right/left percutaneous nephrolithotomy (2.6 cm cm.). First stage of a staged procedure. 4. Antegrade double-J stent placement. 5. Right nephrostomy tube placement  SURGEON:  Garnett FarmMark C Tehila Sokolow  INDICATION: Arthur HawthorneClarence Gulla is a 69 year old male with a 2.6 cm right lower pole renal calculus with a stone in a lower pole calyx immediately cephalad to the largest stone. He has been having right-sided pain. We've discussed the treatment options and he has elected to proceed with percutaneous nephrolithotomy. Placement of his nephrostomy access by interventional radiology was somewhat difficult with blood loss occurring during that procedure.  ANESTHESIA:  General  EBL:  200 mL  DRAINS: 6 [place]French, 24 double-J stent in the right ureter and a 20 [place] council tip catheter as a nephrostomy tube  LOCAL MEDICATIONS USED:  None  SPECIMEN:  Stone given the patient  Description of procedure: After informed consent the patient was taken to the operating room and administered general endotracheal anesthesia. Once fully anesthetized the patient had an 8618 French Foley catheter placed It was then moved from the stretcher onto the operating room table in a prone position with bony prominences padded and chest pads in place. The flank with exiting nephrostomy catheter was then sterilely prepped and draped in standard fashion. An official timeout was then performed.  Using the existing nephrostomy catheter access I passed a 0.038 inch floppy tipped guidewire down the ureter into the bladder under fluoroscopy.  This was left in place and the nephrostomy catheter was removed.  A transverse incision was made over the guidewire and a peel-away coaxial catheter was then passed over the  guidewire and down the ureter under fluoroscopy.  The inner portion of the coaxial system was then removed and a second guidewire was passed through this catheter and down the ureter into the bladder under fluoroscopy.  The coaxial catheter was then removed and one of the guidewires was secured to the drape as a safety guidewire and the second guidewire was used as a working guidewire.  The NephroMax nephrostomy dilating balloon was then passed over the working guidewire into the area of the renal pelvis under fluoroscopy.  It was then inflated using dilute contrast under fluoroscopy until the balloon was fully inflated.  I then passed the 28 French nephrostomy access sheath over the balloon into the area of the renal pelvis under fluoroscopy and then deflated the balloon and removed the dilating balloon.  The 3026 French rigid nephroscope was then passed under direct vision through the nephrostomy access sheath.  Clotted blood was evacuated and the stone was identified. I used the Music therapistwiss lithoclast to fragment the stone and then used the 2 prong graspers to grasp the fragments of stone and remove these until all visible stone had been completely removed. I then removed the rigid nephroscope.  The flexible cystoscope was passed through the access sheath and I systematically inspected all of the calyces that I was able to access. I was able to advance the scope into the upper pole and no stones were located there. I did find what I thought was a middle pole calyx without any evidence of stones and I could see lower pole calyces but I could not find the smaller stone in the lower pole. It could be seen on fluoroscopy and I used fluoroscopy to help guide my scope to  wear it appeared the stone was but I was unable to identify the stone despite retroflexion of the scope and multiple maneuvers and a great deal of time spent. I therefore removed the flexible cystoscope and reinserted the rigid nephroscope over the  working guidewire.  The stent was passed over the guidewire down the ureter and into the bladder and as I removed the guidewire I noted good curl in the bladder. I placed the stent in a lower pole calyx and then passed a guidewire through the rigid nephroscope and down the ureter. This was left in place and I passed the 20 [place] council tip catheter over the guidewire so that it was located in the area the renal pelvis and filled the balloon with approximately 2 mL of dilute contrast. I then removed the safety and working guidewire and secured the nephrostomy tube to the skin with a figure-of-eight 2-0 silk suture after which sterile 4 x 4's, an ABD and Tegaderm were applied. The catheter, which was serving as a nephrostomy tube, was connected to closed system drainage and the patient was awakened and taken to the recovery room in stable and satisfactory condition. He tolerated the procedure well with no intraoperative complications.  PLAN OF CARE: Discharge to home after an overnight stay.  PATIENT DISPOSITION:  PACU - hemodynamically stable.

## 2015-09-18 NOTE — Progress Notes (Signed)
Night of surgery note  Subjective: The patient is doing well.  He is not having any significant flank pain but was having some suprapubic pain and his catheter stopped draining soit was irrigated and he is feeling better now.  Objective: Vital signs in last 24 hours: Temp:  [97.7 F (36.5 C)-98 F (36.7 C)] 97.7 F (36.5 C) (12/22 1522) Pulse Rate:  [76-95] 91 (12/22 1623) Resp:  [11-26] 21 (12/22 1623) BP: (140-175)/(67-114) 157/83 mmHg (12/22 1615) SpO2:  [89 %-100 %] 99 % (12/22 1623) Weight:  [71.668 kg (158 lb)] 71.668 kg (158 lb) (12/22 0733)  Intake/Output this shift: Total I/O In: 2000 [I.V.:2000] Out: 250 [Urine:250]  Physical Exam:  General: Alert and oriented. Abdomen: Soft, Nondistended. Flank dressing is dry and intact. Nephrostomy tube is not draining but was not expected to have significant drainage. Foley catheter is draining bloody urine.  Lab Results:  Recent Labs  09/18/15 0805 09/18/15 1529  HGB 13.0 12.3*  HCT 39.5 37.6*    Assessment/Plan: His hemoglobin is fine. He did have some clots nd will likely need to have his Foley catheter irrigated p.r.n. 1) Continue to monitor 2) Per orders   Yitzchak Kothari C. Vernie Ammonsttelin, MD  Garnett FarmTTELIN,Trentan Trippe C 09/18/2015, 4:40 PM

## 2015-09-18 NOTE — Progress Notes (Signed)
Hgb. And Hct. Drawn by lab. 

## 2015-09-18 NOTE — Progress Notes (Signed)
Foley catheter stopped draining- Dr. Vernie Ammonsttelin notified- Orders given- Foley catheter irrigated with 30 cc Normal Saline x3- few clots obtained- urine flowing freely now- urine red in color with no clots noted  At this time

## 2015-09-18 NOTE — Procedures (Signed)
Interventional Radiology Procedure Note  Procedure: Right perc nephrostomy with nephrostogram and introduction of ureteral stent  Complications: None  Estimated Blood Loss: 50 mL  Recommendations: To OR for completion PCNL   Signed,  Sterling BigHeath K. McCullough, MD

## 2015-09-18 NOTE — Progress Notes (Signed)
Lab results noted- Hgb. 12.3- Hct. 37.6

## 2015-09-18 NOTE — Anesthesia Preprocedure Evaluation (Addendum)
Anesthesia Evaluation  Patient identified by MRN, date of birth, ID band Patient awake    Reviewed: Allergy & Precautions, NPO status , Patient's Chart, lab work & pertinent test results  Airway Mallampati: III  TM Distance: >3 FB Neck ROM: Full    Dental  (+) Dental Advisory Given, Partial Lower, Partial Upper, Missing   Pulmonary  History of tuberculosis s/p 6 month treatment   Pulmonary exam normal breath sounds clear to auscultation       Cardiovascular Exercise Tolerance: Good (-) hypertension(-) CAD and (-) Past MI negative cardio ROS Normal cardiovascular exam Rhythm:Regular Rate:Normal     Neuro/Psych negative neurological ROS  negative psych ROS   GI/Hepatic negative GI ROS, Neg liver ROS,   Endo/Other  diabetes, Insulin Dependent  Renal/GU Nephrolithiasis     Musculoskeletal negative musculoskeletal ROS (+)   Abdominal   Peds  Hematology negative hematology ROS (+)   Anesthesia Other Findings Day of surgery medications reviewed with the patient.  Reproductive/Obstetrics                         Anesthesia Physical Anesthesia Plan  ASA: II  Anesthesia Plan: General   Post-op Pain Management:    Induction: Intravenous  Airway Management Planned: Oral ETT  Additional Equipment:   Intra-op Plan:   Post-operative Plan: Extubation in OR  Informed Consent: I have reviewed the patients History and Physical, chart, labs and discussed the procedure including the risks, benefits and alternatives for the proposed anesthesia with the patient or authorized representative who has indicated his/her understanding and acceptance.   Dental advisory given  Plan Discussed with: CRNA  Anesthesia Plan Comments: (Risks/benefits of general anesthesia discussed with patient including risk of damage to teeth, lips, gum, and tongue, nausea/vomiting, allergic reactions to medications, and the  possibility of heart attack, stroke and death.  All patient questions answered.  Patient wishes to proceed.  **Patient seen after IR perc nephrostomy tube placement where he received fentanyl and midazolam.**)      Anesthesia Quick Evaluation

## 2015-09-18 NOTE — Anesthesia Postprocedure Evaluation (Signed)
Anesthesia Post Note  Patient: Arthur Reed  Procedure(s) Performed: Procedure(s) (LRB): RIGHT PERCUTANEOUS NEPHROLITHOTOMY  (Right)  Patient location during evaluation: PACU Anesthesia Type: General Level of consciousness: awake and alert Pain management: pain level controlled Vital Signs Assessment: post-procedure vital signs reviewed and stable Respiratory status: spontaneous breathing, nonlabored ventilation, respiratory function stable and patient connected to nasal cannula oxygen Cardiovascular status: blood pressure returned to baseline and stable Postop Assessment: no signs of nausea or vomiting Anesthetic complications: no    Last Vitals:  Filed Vitals:   09/18/15 1532 09/18/15 1545  BP: 165/79 153/88  Pulse: 95 87  Temp:    Resp: 26 14    Last Pain:  Filed Vitals:   09/18/15 1557  PainSc: 0-No pain                 [name], [name]

## 2015-09-19 ENCOUNTER — Encounter (HOSPITAL_COMMUNITY): Payer: Self-pay | Admitting: Urology

## 2015-09-19 DIAGNOSIS — N200 Calculus of kidney: Secondary | ICD-10-CM | POA: Diagnosis not present

## 2015-09-19 LAB — HEMOGLOBIN AND HEMATOCRIT, BLOOD
HCT: 31.9 % — ABNORMAL LOW (ref 39.0–52.0)
Hemoglobin: 10.6 g/dL — ABNORMAL LOW (ref 13.0–17.0)

## 2015-09-19 LAB — GLUCOSE, CAPILLARY: Glucose-Capillary: 192 mg/dL — ABNORMAL HIGH (ref 65–99)

## 2015-09-19 MED ORDER — OXYCODONE HCL 10 MG PO TABS: 10.0000 mg | ORAL_TABLET | ORAL | Status: DC | PRN

## 2015-09-19 MED ORDER — PHENAZOPYRIDINE HCL 200 MG PO TABS: 200.0000 mg | ORAL_TABLET | Freq: Three times a day (TID) | ORAL | Status: DC | PRN

## 2015-09-19 NOTE — Progress Notes (Signed)
Discussed with patient and spouse discharge instructions, both verbalized agreement and understanding.  Patient's IV was discontinued with no complications.  Patient to go down in wheelchair with all belongings to go home in private vehicle. 

## 2015-09-19 NOTE — Anesthesia Postprocedure Evaluation (Signed)
Anesthesia Post Note  Patient: Arthur Reed  Procedure(s) Performed: Procedure(s) (LRB): RIGHT PERCUTANEOUS NEPHROLITHOTOMY  (Right)  Patient location during evaluation: PACU Anesthesia Type: General Level of consciousness: awake and alert Pain management: pain level controlled Vital Signs Assessment: post-procedure vital signs reviewed and stable Respiratory status: spontaneous breathing, nonlabored ventilation, respiratory function stable and patient connected to nasal cannula oxygen Cardiovascular status: blood pressure returned to baseline and stable Postop Assessment: no signs of nausea or vomiting Anesthetic complications: no    Last Vitals:  Filed Vitals:   09/19/15 0155 09/19/15 0559  BP: 140/63 128/55  Pulse: 80 87  Temp: 36.6 C 36.8 C  Resp: 20 20    Last Pain:  Filed Vitals:   09/19/15 0728  PainSc: 5                  Cecile HearingStephen Edward Chrstopher Malenfant

## 2015-09-19 NOTE — Discharge Summary (Signed)
Physician Discharge Summary  Patient ID: Arthur HawthorneClarence Reed MRN: [id number] DOB/AGE: May 28, 1946 69 y.o.  Admit date: 09/18/2015 Discharge date: 09/19/2015  Admission Diagnoses: Right renal calculus  Discharge Diagnoses:  Active Problems:   Renal calculus, right   Discharged Condition: good  Hospital Course: He had a large stone occupying the lower pole calyx that seemed to be causing some discomfort as well.  It had been present for some time.  It had increased in size.  We therefore discussed the treatment options and it was felt the stone size would make it amenable to a percutaneous procedure and he was therefore electively admitted for a right PCNL. He underwent right PCNL after interventional radiology obtained access.  They had some difficulty getting past the stone but eventually were successful.  I was able to visualize the very large stone, fragmented and removed all of it.  Flexible nephroscopy was undertaken but the stone that was seen in an adjacent lower pole calyx could not be visualized.  A stent was placed as well as a nephrostomy tube and he did have some postoperative hematuria that eventually cleared. He is currently having some slight discomfort in the right flank region but is tolerating having his nephrostomy tube clamped.  His catheter has been removed and he is voiding spontaneously.  He has no nausea and is tolerating a diet.  His postoperative CT scan revealed the known stone in the lower pole calyx that appeared to be parallel to the nephrostomy tract remained present but otherwise there was good clearance of the large stone with a single 2 mm fragment remaining.  No significant perinephric hematoma was present.  He is postoperative hemoglobin was down slightly and this morning is down just slightly more but there is no evidence of active bleeding.  This appears to be from blood loss due to his interventional procedure and his PCNL.  A transfusion is not indicated as he  is not tachycardic or orthostatic. His nephrostomy tube was removed without difficulty.  He is felt ready for discharge at this time and was given discharge instructions and a follow-up.  We will discuss retrograde management of the remaining stone at his follow-up visit.    Discharge Exam: Blood pressure 128/55, pulse 87, temperature 98.3 F (36.8 C), temperature source Oral, resp. rate 20, height 5\' 8"  (1.727 m), weight 71.668 kg (158 lb), SpO2 98 %. General appearance: alert, cooperative and no distress Back: nephrostomy tube site without drainage around the tube.  No evidence of inflammation or erythema.  No ecchymoses or swelling.  Disposition: he will be discharged home with a prescription for oxycodone10 mg and Pyridium with follow-up in one week  Discharge Instructions    Discharge patient    Complete by:  As directed             Medication List    TAKE these medications        acetaminophen 325 MG tablet  Commonly known as:  TYLENOL  Take 975 mg by mouth 2 (two) times daily as needed for mild pain or moderate pain.     HYDROcodone-acetaminophen 5-325 MG tablet  Commonly known as:  NORCO/VICODIN  Take 1-2 tablets by mouth 2 (two) times daily as needed for moderate pain or severe pain.     ibuprofen 600 MG tablet  Commonly known as:  ADVIL,MOTRIN  Take 600 mg by mouth every 6 (six) hours as needed for mild pain.     insulin aspart 100 UNIT/ML injection  Commonly known  as:  novoLOG  Inject 8 Units into the skin 3 (three) times daily before meals.     insulin glargine 100 UNIT/ML injection  Commonly known as:  LANTUS  Inject 15 Units into the skin at bedtime.     Oxycodone HCl 10 MG Tabs  Take 1 tablet (10 mg total) by mouth every 4 (four) hours as needed.     phenazopyridine 200 MG tablet  Commonly known as:  PYRIDIUM  Take 1 tablet (200 mg total) by mouth 3 (three) times daily as needed for pain.           Follow-up Information    Follow up with  Garnett Farm, MD On 09/30/2015.   Specialty:  Urology   Why:  For your appiontment at 9:00   Contact information:   [address] [place] [address] [phone]       Signed: Garnett Farm 09/19/2015, 7:10 AM

## 2015-09-28 ENCOUNTER — Emergency Department (HOSPITAL_COMMUNITY): Payer: Non-veteran care

## 2015-09-28 ENCOUNTER — Encounter (HOSPITAL_COMMUNITY): Payer: Self-pay | Admitting: Emergency Medicine

## 2015-09-28 ENCOUNTER — Inpatient Hospital Stay (HOSPITAL_COMMUNITY)
Admission: EM | Admit: 2015-09-28 | Discharge: 2015-09-30 | DRG: 689 | Disposition: A | Discharge status: Home / Self Care | Payer: Non-veteran care | Attending: Internal Medicine | Admitting: Internal Medicine

## 2015-09-28 DIAGNOSIS — N10 Acute tubulo-interstitial nephritis: Secondary | ICD-10-CM | POA: Diagnosis present

## 2015-09-28 DIAGNOSIS — E119 Type 2 diabetes mellitus without complications: Secondary | ICD-10-CM | POA: Diagnosis present

## 2015-09-28 DIAGNOSIS — N12 Tubulo-interstitial nephritis, not specified as acute or chronic: Secondary | ICD-10-CM | POA: Diagnosis not present

## 2015-09-28 DIAGNOSIS — Z794 Long term (current) use of insulin: Secondary | ICD-10-CM | POA: Diagnosis not present

## 2015-09-28 DIAGNOSIS — Z87442 Personal history of urinary calculi: Secondary | ICD-10-CM | POA: Diagnosis not present

## 2015-09-28 DIAGNOSIS — I7102 Dissection of abdominal aorta: Secondary | ICD-10-CM | POA: Diagnosis present

## 2015-09-28 DIAGNOSIS — S37011A Minor contusion of right kidney, initial encounter: Secondary | ICD-10-CM | POA: Diagnosis present

## 2015-09-28 DIAGNOSIS — E1165 Type 2 diabetes mellitus with hyperglycemia: Secondary | ICD-10-CM

## 2015-09-28 DIAGNOSIS — R109 Unspecified abdominal pain: Secondary | ICD-10-CM | POA: Diagnosis not present

## 2015-09-28 DIAGNOSIS — N200 Calculus of kidney: Secondary | ICD-10-CM

## 2015-09-28 DIAGNOSIS — I708 Atherosclerosis of other arteries: Secondary | ICD-10-CM | POA: Diagnosis present

## 2015-09-28 DIAGNOSIS — I771 Stricture of artery: Secondary | ICD-10-CM | POA: Diagnosis present

## 2015-09-28 DIAGNOSIS — N151 Renal and perinephric abscess: Secondary | ICD-10-CM | POA: Insufficient documentation

## 2015-09-28 HISTORY — DX: Stricture of artery: I77.1

## 2015-09-28 HISTORY — DX: Dissection of abdominal aorta: I71.02

## 2015-09-28 LAB — I-STAT CG4 LACTIC ACID, ED
LACTIC ACID, VENOUS: 0.99 mmol/L (ref 0.5–2.0)
LACTIC ACID, VENOUS: 0.8 mmol/L (ref 0.5–2.0)

## 2015-09-28 LAB — URINE MICROSCOPIC-ADD ON: Squamous Epithelial / LPF: NONE SEEN

## 2015-09-28 LAB — URINALYSIS, ROUTINE W REFLEX MICROSCOPIC
Glucose, UA: 100 mg/dL — AB
Ketones, ur: 40 mg/dL — AB
NITRITE: POSITIVE — AB
PROTEIN: 30 mg/dL — AB
SPECIFIC GRAVITY, URINE: 1.046 — AB (ref 1.005–1.030)
pH: 7 (ref 5.0–8.0)

## 2015-09-28 LAB — COMPREHENSIVE METABOLIC PANEL
ALT: 13 U/L — AB (ref 17–63)
ANION GAP: 10 (ref 5–15)
AST: 17 U/L (ref 15–41)
Albumin: 3.7 g/dL (ref 3.5–5.0)
Alkaline Phosphatase: 79 U/L (ref 38–126)
BUN: 12 mg/dL (ref 6–20)
CALCIUM: 9.5 mg/dL (ref 8.9–10.3)
CHLORIDE: 98 mmol/L — AB (ref 101–111)
CO2: 26 mmol/L (ref 22–32)
CREATININE: 1.06 mg/dL (ref 0.61–1.24)
Glucose, Bld: 203 mg/dL — ABNORMAL HIGH (ref 65–99)
Potassium: 4.4 mmol/L (ref 3.5–5.1)
Sodium: 134 mmol/L — ABNORMAL LOW (ref 135–145)
Total Bilirubin: 0.9 mg/dL (ref 0.3–1.2)
Total Protein: 7.6 g/dL (ref 6.5–8.1)

## 2015-09-28 LAB — CBC
HCT: 31.9 % — ABNORMAL LOW (ref 39.0–52.0)
Hemoglobin: 10.6 g/dL — ABNORMAL LOW (ref 13.0–17.0)
MCH: 27.7 pg (ref 26.0–34.0)
MCHC: 33.2 g/dL (ref 30.0–36.0)
MCV: 83.5 fL (ref 78.0–100.0)
PLATELETS: 363 10*3/uL (ref 150–400)
RBC: 3.82 MIL/uL — AB (ref 4.22–5.81)
RDW: 12.7 % (ref 11.5–15.5)
WBC: 17.2 10*3/uL — AB (ref 4.0–10.5)

## 2015-09-28 LAB — BASIC METABOLIC PANEL
Anion gap: 9 (ref 5–15)
BUN: 12 mg/dL (ref 6–20)
CALCIUM: 9.1 mg/dL (ref 8.9–10.3)
CHLORIDE: 95 mmol/L — AB (ref 101–111)
CO2: 27 mmol/L (ref 22–32)
CREATININE: 1.12 mg/dL (ref 0.61–1.24)
Glucose, Bld: 225 mg/dL — ABNORMAL HIGH (ref 65–99)
Potassium: 4.3 mmol/L (ref 3.5–5.1)
SODIUM: 131 mmol/L — AB (ref 135–145)

## 2015-09-28 LAB — DIFFERENTIAL
BASOS ABS: 0 10*3/uL (ref 0.0–0.1)
Basophils Relative: 0 %
EOS ABS: 0 10*3/uL (ref 0.0–0.7)
EOS PCT: 0 %
LYMPHS PCT: 11 %
Lymphs Abs: 1.8 10*3/uL (ref 0.7–4.0)
MONO ABS: 1.5 10*3/uL — AB (ref 0.1–1.0)
Monocytes Relative: 9 %
NEUTROS ABS: 13.3 10*3/uL — AB (ref 1.7–7.7)
NEUTROS PCT: 80 %

## 2015-09-28 MED ORDER — SODIUM CHLORIDE 0.9 % IV SOLN
500.0000 mg | Freq: Two times a day (BID) | INTRAVENOUS | Status: DC
  Administered 2015-09-29 – 2015-09-30 (×2): 500 mg via INTRAVENOUS
  Filled 2015-09-28 (×3): qty 500

## 2015-09-28 MED ORDER — IOHEXOL 300 MG/ML  SOLN
100.0000 mL | Freq: Once | INTRAMUSCULAR | Status: AC | PRN
  Administered 2015-09-28: 100 mL via INTRAVENOUS

## 2015-09-28 MED ORDER — PIPERACILLIN-TAZOBACTAM 3.375 G IVPB 30 MIN
3.3750 g | INTRAVENOUS | Status: AC
  Administered 2015-09-28: 3.375 g via INTRAVENOUS
  Filled 2015-09-28: qty 50

## 2015-09-28 MED ORDER — PIPERACILLIN-TAZOBACTAM 3.375 G IVPB
3.3750 g | Freq: Three times a day (TID) | INTRAVENOUS | Status: DC
  Administered 2015-09-29 – 2015-09-30 (×4): 3.375 g via INTRAVENOUS
  Filled 2015-09-28 (×5): qty 50

## 2015-09-28 MED ORDER — FENTANYL CITRATE (PF) 100 MCG/2ML IJ SOLN
25.0000 ug | Freq: Once | INTRAMUSCULAR | Status: AC
  Administered 2015-09-28: 25 ug via INTRAVENOUS
  Filled 2015-09-28: qty 2

## 2015-09-28 MED ORDER — SODIUM CHLORIDE 0.9 % IV BOLUS (SEPSIS)
1000.0000 mL | Freq: Once | INTRAVENOUS | Status: AC
  Administered 2015-09-28: 1000 mL via INTRAVENOUS

## 2015-09-28 MED ORDER — VANCOMYCIN HCL IN DEXTROSE 1-5 GM/200ML-% IV SOLN
1000.0000 mg | INTRAVENOUS | Status: AC
  Administered 2015-09-28: 1000 mg via INTRAVENOUS
  Filled 2015-09-28: qty 200

## 2015-09-28 NOTE — ED Notes (Signed)
Pt states that he had a kidney stone removed and a stent placed in his kidney on 12/27 but since then has had continued pain and loss of appetite. Dr. Phoebe Perchtelin did the surgery. Alert and oriented.

## 2015-09-28 NOTE — Progress Notes (Signed)
ANTIBIOTIC CONSULT NOTE - INITIAL  Pharmacy Consult for Vancomycin & Zosyn Indication: Sepsis  No Known Allergies  Patient Measurements: Height: 5\' 8"  (172.7 cm) Weight: 140 lb (63.504 kg) IBW/kg (Calculated) : 68.4  Vital Signs: Temp: 100.2 F (37.9 C) (01/01 1811) Temp Source: Oral (01/01 1811) BP: 142/73 mmHg (01/01 2030) Pulse Rate: 99 (01/01 2030) Intake/Output from previous day:   Intake/Output from this shift:    Labs:  Recent Labs  09/28/15 1646  WBC 17.2*  HGB 10.6*  PLT 363  CREATININE 1.12   Estimated Creatinine Clearance: 56.7 mL/min (by C-G formula based on Cr of 1.12). No results for input(s): VANCOTROUGH, VANCOPEAK, VANCORANDOM, GENTTROUGH, GENTPEAK, GENTRANDOM, TOBRATROUGH, TOBRAPEAK, TOBRARND, AMIKACINPEAK, AMIKACINTROU, AMIKACIN in the last 72 hours.   Microbiology: No results found for this or any previous visit (from the past 720 hour(s)).  Medical History: Past Medical History  Diagnosis Date  . Diabetes mellitus without complication (HCC)   . History of kidney stones     kidney stones x2  . Tuberculosis     tx. for 6 months -no reason given for this.Don't know if Test was Positive or not.  . Complication of anesthesia     12/22 patient states never had problems with anesthesia    Medications:  Scheduled:   Infusions:  . piperacillin-tazobactam    . vancomycin     Assessment:  [age] yr male s/p recent kidney stone removal and stent placed on 12/27.  Presents with c/o pain and loss of appetite.  Patient febrile with elevated WBC  Pharmacy consulted to dose Vancomycin and Zosyn for sepsis.  CrCl ~ 56 ml/min   1/1 >>Vanc >> 1/1 >>Zosyn >>    1/1 blood: 1/1 urine:  Trough/Dose change info:  Goal of Therapy:  Vancomycin trough level 15-20 mcg/ml  Plan:  Measure antibiotic drug levels at steady state Follow up culture results   Vancomycin 1gm IV x 1 followed by 500mg  IV q12h  Zosyn 3.375gm IV x 1 over 30 min in ED  followed by 3.375gm IV q8h (each dose infused over 4 hrs)  Cissy Galbreath, Joselyn GlassmanLeann Trefz, PharmD 09/28/2015,9:24 PM

## 2015-09-28 NOTE — ED Notes (Signed)
Patient transported to CT 

## 2015-09-28 NOTE — ED Provider Notes (Signed)
The pt had percutaneous nephrolithotomy done over a week ago, he presents with ongoing pain in the R flank with dec PO intake and now with fevers and weakness.  On exam - the surgical site is clean without redness, drainage but with mild ttp.  abd is soft and NT, mild tachycardia, borderline fever, WBC elevated - se[psis w/u.  Anticipate urinary source.  Medical screening examination/treatment/procedure(s) were conducted as a shared visit with non-physician practitioner(s) and myself.  I personally evaluated the patient during the encounter.  Clinical Impression:   Final diagnoses:  Pyelonephritis         Eber HongBrian Dianna Deshler, MD 10/07/15 1159

## 2015-09-28 NOTE — ED Provider Notes (Signed)
CSN: [id number]     Arrival date & time 09/28/15  1556 History   First MD Initiated Contact with Patient 09/28/15 1806     Chief Complaint  Patient presents with  . Flank Pain  . Post-op Problem   HPI  Arthur Reed is a 70 y.o. M PMH significant for kidney stones, s/p percutaneous nephrolithotomy  12/22 (Dr. Ihor Gully) presenting with right flank pain and right abdominal pain since his procedure. He describes his pain as 10/10 pain scale, constant, initially worse with urination but recently has improved with urination, sharp, non-radiating. He endorses decreased PO intake, weight loss of 7 lbs, and intermittent hematuria. He has been on oxycodone (takes 4-6 per day) and pyridium. He denies fevers, chills, N/V/D.  Past Medical History  Diagnosis Date  . Diabetes mellitus without complication (HCC)   . History of kidney stones     kidney stones x2  . Tuberculosis     tx. for 6 months -no reason given for this.Don't know if Test was Positive or not.  . Complication of anesthesia     12/22 patient states never had problems with anesthesia   Past Surgical History  Procedure Laterality Date  . Eye surgery      laser for eye glaucoma  . Nephrolithotomy Right 09/18/2015    Procedure: RIGHT PERCUTANEOUS NEPHROLITHOTOMY ;  Surgeon: Ihor Gully, MD;  Location: [facility];  Service: Urology;  Laterality: Right;   History reviewed. No pertinent family history. Social History  Substance Use Topics  . Smoking status: Never Smoker   . Smokeless tobacco: None  . Alcohol Use: 1.2 - 1.8 oz/week    1-2 Cans of beer, 1 Shots of liquor per week     Comment: beer or 1 drink nightly    Review of Systems  Ten systems are reviewed and are negative for acute change except as noted in the HPI  Allergies  Review of patient's allergies indicates no known allergies.  Home Medications   Prior to Admission medications   Medication Sig Start Date End Date Taking? Authorizing Provider   acetaminophen (TYLENOL) 325 MG tablet Take 975 mg by mouth 2 (two) times daily as needed for mild pain or moderate pain.   Yes Historical Provider, MD  HYDROcodone-acetaminophen (NORCO/VICODIN) 5-325 MG tablet Take 1-2 tablets by mouth 2 (two) times daily as needed for moderate pain or severe pain.   Yes Historical Provider, MD  insulin aspart (NOVOLOG) 100 UNIT/ML injection Inject 8 Units into the skin 3 (three) times daily before meals.   Yes Historical Provider, MD  insulin glargine (LANTUS) 100 UNIT/ML injection Inject 15 Units into the skin at bedtime.   Yes Historical Provider, MD  Oxycodone HCl 10 MG TABS Take 1 tablet (10 mg total) by mouth every 4 (four) hours as needed. 09/19/15  Yes Ihor Gully, MD  phenazopyridine (PYRIDIUM) 200 MG tablet Take 1 tablet (200 mg total) by mouth 3 (three) times daily as needed for pain. 09/19/15  Yes Ihor Gully, MD   BP 157/82 mmHg  Pulse 103  Temp(Src) 100.2 F (37.9 C) (Oral)  Resp 18  Ht 5\' 8"  (1.727 m)  Wt 63.504 kg  BMI 21.29 kg/m2  SpO2 98% Physical Exam  Constitutional: He appears well-developed and well-nourished. No distress.  HENT:  Head: Normocephalic and atraumatic.  Mouth/Throat: Oropharynx is clear and moist. No oropharyngeal exudate.  Eyes: Conjunctivae are normal. Pupils are equal, round, and reactive to light. Right eye exhibits no discharge. Left eye exhibits  no discharge. No scleral icterus.  Neck: No tracheal deviation present.  Cardiovascular: Regular rhythm, normal heart sounds and intact distal pulses.  Exam reveals no gallop and no friction rub.   No murmur heard. Tachycardia  Pulmonary/Chest: Effort normal and breath sounds normal. No respiratory distress. He has no wheezes. He has no rales. He exhibits no tenderness.  Abdominal: Soft. Bowel sounds are normal. He exhibits no distension and no mass. There is tenderness. There is no rebound and no guarding.  Right flank tenderness and right sided abdominal tenderness.   Musculoskeletal: He exhibits no edema.  Lymphadenopathy:    He has no cervical adenopathy.  Neurological: He is alert. Coordination normal.  Skin: Skin is warm and dry. No rash noted. He is not diaphoretic. No erythema.  Clean right flank surgical site without erythema or drainage.   Psychiatric: He has a normal mood and affect. His behavior is normal.  Nursing note and vitals reviewed.   ED Course  Procedures  Labs Review Labs Reviewed  BASIC METABOLIC PANEL - Abnormal; Notable for the following:    Sodium 131 (*)    Chloride 95 (*)    Glucose, Bld 225 (*)    All other components within normal limits  CBC - Abnormal; Notable for the following:    WBC 17.2 (*)    RBC 3.82 (*)    Hemoglobin 10.6 (*)    HCT 31.9 (*)    All other components within normal limits  URINE CULTURE  URINALYSIS, ROUTINE W REFLEX MICROSCOPIC (NOT AT [facility])   Imaging Review Ct Abdomen Pelvis W Contrast  09/28/2015  CLINICAL DATA:  Patient status post right renal stone removal and stent placement 09/23/2015. Continued pain and loss of appetite. EXAM: CT ABDOMEN AND PELVIS WITH CONTRAST TECHNIQUE: Multidetector CT imaging of the abdomen and pelvis was performed using the standard protocol following bolus administration of intravenous contrast. CONTRAST:  OMNIPAQUE IOHEXOL 300 MG/ML  SOLN COMPARISON:  CT abdomen pelvis 09/18/2015 FINDINGS: Lower chest: Normal heart size. Dependent atelectasis within the lung bases. Small right pleural effusion. Hepatobiliary: Liver is normal in size and contour. No focal hepatic lesion identified. Gallbladder is unremarkable. No intrahepatic or extrahepatic biliary ductal dilatation. Pancreas: Unremarkable Spleen: Unremarkable Adrenals/Urinary Tract: The adrenal glands are normal. There is a new low-attenuation subcapsular fluid collection about the right kidney measuring up to 7.4 x 2.3 cm. This exerts mass effect on the right renal cortex. The kidneys enhance with contrast. A  right-sided double-J nephroureteral stent is in place and appears in appropriate position. There is wall thickening and stranding along the course of the right ureter. There is an 8 mm stone within the interpolar region of the right kidney. There is an additional punctate 2 mm stone within the inferior pole of the right kidney. There is an additional 4 mm stone within the superior pole of the right kidney. Multiple nonobstructing stones are demonstrated within the left kidney with the largest in the inferior pole measuring 8 mm. Stomach/Bowel: No abnormal bowel wall thickening or evidence for bowel obstruction. No free intraperitoneal air. Normal morphology of the stomach. Vascular/Lymphatic: Peripheral calcified atherosclerotic plaque involving the abdominal aorta. Infrarenal abdominal aortic ectasia measuring 2.3 cm. Additionally there is suggestion of a short-segment chronic dissection within the infrarenal abdominal aorta (image 38; series 2). There is significant atherosclerotic plaque at the origin of the right common iliac artery causing marked stenosis (image 49; series 2). Other: Prostate unremarkable. Musculoskeletal: No aggressive or acute appearing osseous lesions.  Lumbar spine degenerative changes. IMPRESSION: Interval development of a large low-attenuation subcapsular fluid collection about the right kidney exerting mass effect on the right renal parenchyma. This may potentially represent a urinoma or seroma, postprocedural in etiology. An evolved hematoma is additional consideration. Double-J right nephroureteral stent is in place and appears in appropriate position. Wall thickening and surrounding stranding about the course of the right ureter is likely postprocedural in etiology. Recommend correlation with urinalysis to exclude the possibility of ascending infectious process. Bilateral nonobstructing nephrolithiasis. Marked atherosclerotic plaque of the right common iliac artery causes severe  stenosis. Infrarenal abdominal aortic ectasia. Short segment infrarenal abdominal dissection, likely chronic given associated calcification. These results were called by telephone at the time of interpretation on 09/28/2015 at 9:18 pm to Dr. Althea GrimmerSAMANTHA Telford Archambeau , who verbally acknowledged these results. Electronically Signed   By: Annia Beltrew  Davis M.D.   On: 09/28/2015 21:30   I have personally reviewed and evaluated these images and lab results as part of my medical decision-making.  EKG Interpretation  Date/Time:  Sunday September 28 2015 20:03:45 EST Ventricular Rate:  101 PR Interval:  124 QRS Duration: 86 QT Interval:  328 QTC Calculation: 425 R Axis:   62 Text Interpretation:  Age not entered, assumed to be  70 years old for purpose of ECG interpretation Sinus tachycardia ST elev, probable normal early repol pattern ED PHYSICIAN INTERPRETATION AVAILABLE IN [facility] Confirmed by TEST, Record ([id number]) on 09/29/2015 6:51:06 AM   MDM   Final diagnoses:  Pyelonephritis   Patient uncomfortable appearing with fever and tachycardia. Sepsis workup ordered, fluids given.  CBC with leukocytosis of 17.2, hgb 10.6. Hyponatremia of 131 and hyperglycemia of 225 on BMP, otherwise unremarkable. EKG with sinus tachycardia and ST elevation, probably normal early repolarization pattern. No STEMI.  CT demonstrates the following acute changes: interval development of a large low-attenuation subcapsular fluid collection about the right kidney exerting mass effect on the right renal parenchyma, which is possibly a urinoma or seroma, postprocedural in etiology. An evolved hematoma is additional consideration. Double-J right nephroureteral stent is in place and appears in appropriate position. Wall thickening and surrounding stranding about the course of the right ureter is likely postprocedural in etiology.  Nitrite positive UA with moderate hgb, small leukocytes, ketones, bili, and glucosuria. Vanc and zosyn started in  ED.  Spoke with urology who advised hospital admission for pyelonephritis.  Patient's PCP is Paulino Doorynthia Earl, [facility]C [facility] but he is looking to switch PCPs because he is frustrated with his care.  Dr. Robb Matarrtiz agrees to admission. Patient feeling better on reassessment. Patient in understanding and agreement with the plan and can be safely admitted.    Melton KrebsSamantha Nicole Kashawna Manzer, PA-C 10/05/15 1952  Eber HongBrian Miller, MD 10/07/15 [phone]

## 2015-09-28 NOTE — ED Notes (Signed)
MD at bedside. 

## 2015-09-29 ENCOUNTER — Inpatient Hospital Stay (HOSPITAL_COMMUNITY): Payer: Non-veteran care

## 2015-09-29 ENCOUNTER — Encounter (HOSPITAL_COMMUNITY): Payer: Self-pay | Admitting: Internal Medicine

## 2015-09-29 DIAGNOSIS — I7102 Dissection of abdominal aorta: Secondary | ICD-10-CM

## 2015-09-29 DIAGNOSIS — I771 Stricture of artery: Secondary | ICD-10-CM

## 2015-09-29 DIAGNOSIS — N12 Tubulo-interstitial nephritis, not specified as acute or chronic: Secondary | ICD-10-CM

## 2015-09-29 DIAGNOSIS — N151 Renal and perinephric abscess: Secondary | ICD-10-CM | POA: Insufficient documentation

## 2015-09-29 DIAGNOSIS — E119 Type 2 diabetes mellitus without complications: Secondary | ICD-10-CM

## 2015-09-29 DIAGNOSIS — E1165 Type 2 diabetes mellitus with hyperglycemia: Secondary | ICD-10-CM

## 2015-09-29 DIAGNOSIS — N10 Acute tubulo-interstitial nephritis: Principal | ICD-10-CM

## 2015-09-29 DIAGNOSIS — N200 Calculus of kidney: Secondary | ICD-10-CM

## 2015-09-29 HISTORY — DX: Dissection of abdominal aorta: I71.02

## 2015-09-29 LAB — CBC WITH DIFFERENTIAL/PLATELET
BASOS ABS: 0 10*3/uL (ref 0.0–0.1)
Basophils Relative: 0 %
EOS PCT: 1 %
Eosinophils Absolute: 0.1 10*3/uL (ref 0.0–0.7)
HEMATOCRIT: 28.5 % — AB (ref 39.0–52.0)
Hemoglobin: 9.3 g/dL — ABNORMAL LOW (ref 13.0–17.0)
LYMPHS ABS: 1.9 10*3/uL (ref 0.7–4.0)
LYMPHS PCT: 14 %
MCH: 27.4 pg (ref 26.0–34.0)
MCHC: 32.6 g/dL (ref 30.0–36.0)
MCV: 84.1 fL (ref 78.0–100.0)
MONO ABS: 1.1 10*3/uL — AB (ref 0.1–1.0)
MONOS PCT: 8 %
NEUTROS ABS: 10.7 10*3/uL — AB (ref 1.7–7.7)
Neutrophils Relative %: 77 %
PLATELETS: 312 10*3/uL (ref 150–400)
RBC: 3.39 MIL/uL — ABNORMAL LOW (ref 4.22–5.81)
RDW: 12.7 % (ref 11.5–15.5)
WBC: 13.8 10*3/uL — ABNORMAL HIGH (ref 4.0–10.5)

## 2015-09-29 LAB — COMPREHENSIVE METABOLIC PANEL
ALT: 11 U/L — ABNORMAL LOW (ref 17–63)
AST: 14 U/L — AB (ref 15–41)
Albumin: 3 g/dL — ABNORMAL LOW (ref 3.5–5.0)
Alkaline Phosphatase: 71 U/L (ref 38–126)
Anion gap: 9 (ref 5–15)
BILIRUBIN TOTAL: 1.1 mg/dL (ref 0.3–1.2)
BUN: 11 mg/dL (ref 6–20)
CHLORIDE: 98 mmol/L — AB (ref 101–111)
CO2: 27 mmol/L (ref 22–32)
CREATININE: 1.1 mg/dL (ref 0.61–1.24)
Calcium: 8.6 mg/dL — ABNORMAL LOW (ref 8.9–10.3)
Glucose, Bld: 171 mg/dL — ABNORMAL HIGH (ref 65–99)
POTASSIUM: 4 mmol/L (ref 3.5–5.1)
Sodium: 134 mmol/L — ABNORMAL LOW (ref 135–145)
TOTAL PROTEIN: 6.4 g/dL — AB (ref 6.5–8.1)

## 2015-09-29 LAB — GLUCOSE, CAPILLARY
GLUCOSE-CAPILLARY: 131 mg/dL — AB (ref 65–99)
GLUCOSE-CAPILLARY: 161 mg/dL — AB (ref 65–99)
GLUCOSE-CAPILLARY: 170 mg/dL — AB (ref 65–99)
GLUCOSE-CAPILLARY: 206 mg/dL — AB (ref 65–99)
Glucose-Capillary: 185 mg/dL — ABNORMAL HIGH (ref 65–99)

## 2015-09-29 LAB — MAGNESIUM: Magnesium: 2.1 mg/dL (ref 1.7–2.4)

## 2015-09-29 LAB — PHOSPHORUS: PHOSPHORUS: 2.1 mg/dL — AB (ref 2.5–4.6)

## 2015-09-29 MED ORDER — TAMSULOSIN HCL 0.4 MG PO CAPS
0.4000 mg | ORAL_CAPSULE | Freq: Every day | ORAL | Status: DC
  Administered 2015-09-29 – 2015-09-30 (×2): 0.4 mg via ORAL
  Filled 2015-09-29 (×2): qty 1

## 2015-09-29 MED ORDER — INSULIN ASPART 100 UNIT/ML ~~LOC~~ SOLN
0.0000 [IU] | Freq: Three times a day (TID) | SUBCUTANEOUS | Status: DC
  Administered 2015-09-29 (×2): 4 [IU] via SUBCUTANEOUS
  Administered 2015-09-29 – 2015-09-30 (×2): 3 [IU] via SUBCUTANEOUS

## 2015-09-29 MED ORDER — PHENAZOPYRIDINE HCL 200 MG PO TABS
200.0000 mg | ORAL_TABLET | Freq: Three times a day (TID) | ORAL | Status: DC
  Administered 2015-09-29 – 2015-09-30 (×5): 200 mg via ORAL
  Filled 2015-09-29 (×7): qty 1

## 2015-09-29 MED ORDER — OXYCODONE HCL 5 MG PO TABS
10.0000 mg | ORAL_TABLET | ORAL | Status: DC | PRN
  Administered 2015-09-29 – 2015-09-30 (×2): 10 mg via ORAL
  Filled 2015-09-29 (×2): qty 2

## 2015-09-29 MED ORDER — SODIUM CHLORIDE 0.9 % IV SOLN
INTRAVENOUS | Status: DC
  Administered 2015-09-29 – 2015-09-30 (×2): via INTRAVENOUS

## 2015-09-29 MED ORDER — ENSURE ENLIVE PO LIQD
237.0000 mL | Freq: Two times a day (BID) | ORAL | Status: DC
  Administered 2015-09-29 – 2015-09-30 (×2): 237 mL via ORAL

## 2015-09-29 MED ORDER — SODIUM CHLORIDE 0.9 % IJ SOLN
3.0000 mL | Freq: Two times a day (BID) | INTRAMUSCULAR | Status: DC
  Administered 2015-09-30: 3 mL via INTRAVENOUS

## 2015-09-29 MED ORDER — KETOROLAC TROMETHAMINE 15 MG/ML IJ SOLN
15.0000 mg | Freq: Once | INTRAMUSCULAR | Status: AC
  Administered 2015-09-29: 15 mg via INTRAVENOUS
  Filled 2015-09-29: qty 1

## 2015-09-29 MED ORDER — ONDANSETRON HCL 4 MG PO TABS: 4.0000 mg | ORAL_TABLET | Freq: Four times a day (QID) | ORAL | Status: DC | PRN

## 2015-09-29 MED ORDER — HYDROMORPHONE HCL 1 MG/ML IJ SOLN: 1.0000 mg | INTRAMUSCULAR | Status: DC | PRN

## 2015-09-29 MED ORDER — ONDANSETRON HCL 4 MG/2ML IJ SOLN: 4.0000 mg | Freq: Four times a day (QID) | INTRAMUSCULAR | Status: DC | PRN

## 2015-09-29 MED ORDER — PANTOPRAZOLE SODIUM 40 MG PO TBEC
40.0000 mg | DELAYED_RELEASE_TABLET | Freq: Every day | ORAL | Status: DC
  Administered 2015-09-29 – 2015-09-30 (×3): 40 mg via ORAL
  Filled 2015-09-29 (×3): qty 1

## 2015-09-29 MED ORDER — INSULIN GLARGINE 100 UNIT/ML ~~LOC~~ SOLN
15.0000 [IU] | Freq: Every day | SUBCUTANEOUS | Status: DC
  Administered 2015-09-29 – 2015-09-30 (×2): 15 [IU] via SUBCUTANEOUS
  Filled 2015-09-29 (×2): qty 0.15

## 2015-09-29 NOTE — Progress Notes (Signed)
This shift pt arrived to unit via stretcher room 1505. VS taken, pt oriented to room and call bell with no complications. Family at bedside. Gait steady, general weakness alert and oriented x4. Pain 2/10.patient guide at the bedside. Initial assessment completed and  Will continue to monitor pt throughout shift.

## 2015-09-29 NOTE — Progress Notes (Signed)
Progress Note   Arthur Reed Hearst JYN:[id number] DOB: 09/16/1946 DOA: 09/28/2015 PCP: No PCP Per Patient   Brief Narrative:   Arthur Reed Ilyas is an 70 y.o. male with a PMH of type 2 diabetes, urolithiasis, s/p percutaneous nephrolithotomy on 09/18/15 by Dr. Ihor GullyMark Ottelin who was admitted 09/28/15 with complaints of right flank pain, dysuria, intermittent hematuria, chills, fatigue, suprapubic discomfort and decreased appetite. When seen in the emergency department, the patient stated that he was feeling better. The case was discussed by the emergency department medical staff with urology who recommended admission and treatment for acute pyelonephritis  Assessment/Plan:   Principal Problem:  Acute pyelonephritis in the setting of nephrolithiasis and recent instrumentation with right double J stent - Currently on Vancomycin/Zosyn. - F/U urine culture. - Urologist to consult. - Continue Pyridium.  Active Problems:   Dissection of abdominal aorta (HCC), short-segment, chronic, infrarenal/Right iliac artery stenosis - Needs good BP and lipid control.   DM type 2 (diabetes mellitus, type 2) (HCC) - Currently being managed with Lantus 15 units daily and insulin resistant SSI TID.  - CBGs pending.  A.M. Glucose 171.    DVT Prophylaxis - SCDs ordered.   Family Communication/Anticipated D/C date and plan/Code Status   Family Communication: Wife at the bedside. Disposition Plan: Home when cultures back and afebrile for at least 24 hours. Anticipated D/C date:   2-3 days. Code Status: Full code.   IV Access:    Peripheral IV   Procedures and diagnostic studies:   Ct Abdomen Pelvis W Contrast  09/28/2015  CLINICAL DATA:  Patient status post right renal stone removal and stent placement 09/23/2015. Continued pain and loss of appetite. EXAM: CT ABDOMEN AND PELVIS WITH CONTRAST TECHNIQUE: Multidetector CT imaging of the abdomen and pelvis was performed using the standard  protocol following bolus administration of intravenous contrast. CONTRAST:  100mL OMNIPAQUE IOHEXOL 300 MG/ML  SOLN COMPARISON:  CT abdomen pelvis 09/18/2015 FINDINGS: Lower chest: Normal heart size. Dependent atelectasis within the lung bases. Small right pleural effusion. Hepatobiliary: Liver is normal in size and contour. No focal hepatic lesion identified. Gallbladder is unremarkable. No intrahepatic or extrahepatic biliary ductal dilatation. Pancreas: Unremarkable Spleen: Unremarkable Adrenals/Urinary Tract: The adrenal glands are normal. There is a new low-attenuation subcapsular fluid collection about the right kidney measuring up to 7.4 x 2.3 cm. This exerts mass effect on the right renal cortex. The kidneys enhance with contrast. A right-sided double-J nephroureteral stent is in place and appears in appropriate position. There is wall thickening and stranding along the course of the right ureter. There is an 8 mm stone within the interpolar region of the right kidney. There is an additional punctate 2 mm stone within the inferior pole of the right kidney. There is an additional 4 mm stone within the superior pole of the right kidney. Multiple nonobstructing stones are demonstrated within the left kidney with the largest in the inferior pole measuring 8 mm. Stomach/Bowel: No abnormal bowel wall thickening or evidence for bowel obstruction. No free intraperitoneal air. Normal morphology of the stomach. Vascular/Lymphatic: Peripheral calcified atherosclerotic plaque involving the abdominal aorta. Infrarenal abdominal aortic ectasia measuring 2.3 cm. Additionally there is suggestion of a short-segment chronic dissection within the infrarenal abdominal aorta (image 38; series 2). There is significant atherosclerotic plaque at the origin of the right common iliac artery causing marked stenosis (image 49; series 2). Other: Prostate unremarkable. Musculoskeletal: No aggressive or acute appearing osseous lesions.  Lumbar spine degenerative changes. IMPRESSION: Interval development  of a large low-attenuation subcapsular fluid collection about the right kidney exerting mass effect on the right renal parenchyma. This may potentially represent a urinoma or seroma, postprocedural in etiology. An evolved hematoma is additional consideration. Double-J right nephroureteral stent is in place and appears in appropriate position. Wall thickening and surrounding stranding about the course of the right ureter is likely postprocedural in etiology. Recommend correlation with urinalysis to exclude the possibility of ascending infectious process. Bilateral nonobstructing nephrolithiasis. Marked atherosclerotic plaque of the right common iliac artery causes severe stenosis. Infrarenal abdominal aortic ectasia. Short segment infrarenal abdominal dissection, likely chronic given associated calcification. These results were called by telephone at the time of interpretation on 09/28/2015 at 9:18 pm to Dr. Althea Grimmer , who verbally acknowledged these results. Electronically Signed   By: Annia Belt M.D.   On: 09/28/2015 21:30     Medical Consultants:    None.  Anti-Infectives:   Vancomycin 09/29/15---> Zosyn 09/29/15--->  Subjective:   Arthur Hawthorne had some sweats in the night.  No N/V.  No abdominal or back pain.    Objective:    Filed Vitals:   09/28/15 2315 09/28/15 2330 09/29/15 0009 09/29/15 0542  BP: 145/76 139/76 133/64 122/58  Pulse: 93 92 93 70  Temp:   98.8 F (37.1 C) 98.1 F (36.7 C)  TempSrc:   Oral Oral  Resp: 24 24 20 21   Height:   5\' 8"  (1.727 m)   Weight:   65.545 kg (144 lb 8 oz)   SpO2: 96% 96% 98% 98%    Intake/Output Summary (Last 24 hours) at 09/29/15 0746 Last data filed at 09/29/15 0345  Gross per 24 hour  Intake    120 ml  Output    450 ml  Net   -330 ml   Filed Weights   09/28/15 1811 09/29/15 0009  Weight: 63.504 kg (140 lb) 65.545 kg (144 lb 8 oz)    Exam: Gen:   NAD Cardiovascular:  RRR, No M/R/G Respiratory:  Lungs CTAB Gastrointestinal:  Abdomen soft, NT/ND, + BS Extremities:  No C/E/C   Data Reviewed:    Labs: Basic Metabolic Panel:  Recent Labs Lab 09/28/15 1646 09/28/15 2009 09/29/15 0521 09/29/15 0527  NA 131* 134*  --  134*  K 4.3 4.4  --  4.0  CL 95* 98*  --  98*  CO2 27 26  --  27  GLUCOSE 225* 203*  --  171*  BUN 12 12  --  11  CREATININE 1.12 1.06  --  1.10  CALCIUM 9.1 9.5  --  8.6*  MG  --   --  2.1  --   PHOS  --   --  2.1*  --    GFR Estimated Creatinine Clearance: 59.5 mL/min (by C-G formula based on Cr of 1.1). Liver Function Tests:  Recent Labs Lab 09/28/15 2009 09/29/15 0527  AST 17 14*  ALT 13* 11*  ALKPHOS 79 71  BILITOT 0.9 1.1  PROT 7.6 6.4*  ALBUMIN 3.7 3.0*   CBC:  Recent Labs Lab 09/28/15 1646 09/29/15 0527  WBC 17.2* 13.8*  NEUTROABS 13.3* 10.7*  HGB 10.6* 9.3*  HCT 31.9* 28.5*  MCV 83.5 84.1  PLT 363 312   CBG:  Recent Labs Lab 09/29/15 0040  GLUCAP 170*   Sepsis Labs:  Recent Labs Lab 09/28/15 1646 09/28/15 2035 09/28/15 2259 09/29/15 0527  WBC 17.2*  --   --  13.8*  LATICACIDVEN  --  0.99 0.80  --  Microbiology No results found for this or any previous visit (from the past 240 hour(s)).   Medications:   . feeding supplement (ENSURE ENLIVE)  237 mL Oral BID BM  . insulin aspart  0-20 Units Subcutaneous TID WC  . insulin glargine  15 Units Subcutaneous QHS  . pantoprazole  40 mg Oral Daily  . phenazopyridine  200 mg Oral TID WC  . piperacillin-tazobactam (ZOSYN)  IV  3.375 g Intravenous Q8H  . sodium chloride  3 mL Intravenous Q12H  . vancomycin  500 mg Intravenous Q12H   Continuous Infusions: . sodium chloride 100 mL/hr at 09/29/15 0101    Time spent: 25 minutes.   LOS: 1 day   Naava Janeway  [facility] Pager [phone]. If unable to reach me by pager, please call my cell phone at [phone].  *Please refer to [email or web address], password [email or web address] to  get updated schedule on who will round on this patient, as hospitalists switch teams weekly. If 7PM-7AM, please contact night-coverage at [email or web address], password [email or web address] for any overnight needs.  09/29/2015, 7:46 AM

## 2015-09-29 NOTE — H&P (Signed)
[facility] History and Physical  Pharrell Ledford ZOX:[id number] DOB: 1946-02-01 DOA: 09/28/2015  Referring physician: Melton Krebs, PA-C PCP: No PCP Per Patient   Chief Complaint: Flank pain  HPI: Arthur Reed is a 70 y.o. male with a past medical history type 2 diabetes, urolithiasis, status post percutaneous nephrolithotomy on 12/22 by Dr. Ihor Gully who comes to the emergency department with complaints of right flank pain, dysuria, intermittent hematuria, chills, fatigue, suprapubic discomfort and decreased appetite. He states that he has lost about 7 pounds since the procedure. He states that his right flank pain has been as bad as 10/10 and this is only partially controlled by oxycodone 10 mg, which she has been taking 4-6 tablets a day. He denies fever, night sweats, nausea, vomiting, productive cough, chest pain, dizziness or palpitations.  When seen in the emergency department, the patient stated that he was feeling better. The case was discussed by the emergency department medical staff with urology who recommended admission and treatment for acute pyelonephritis.  Review of Systems:  Constitutional:  No , night sweats, Fevers, chills, fatigue.  HEENT:  No headaches, Difficulty swallowing,Tooth/dental problems,Sore throat,  No sneezing, itching, ear ache, nasal congestion, post nasal drip,  Cardio-vascular:  No chest pain, Orthopnea, PND, swelling in lower extremities, anasarca, dizziness, palpitations  GI:  No heartburn, indigestion, abdominal pain, nausea, vomiting, diarrhea, change in bowel habits, loss of appetite  Resp:  No dyspnea, productive cough, wheezing or hemoptysis. Skin:  no rash or lesions.  GU:  As above mentioned. Musculoskeletal:  No joint pain or swelling. No decreased range of motion. No back pain.  Psych:  No change in mood or affect. No depression or anxiety. No memory loss.   Past Medical History  Diagnosis Date  .  Diabetes mellitus without complication (HCC)   . History of kidney stones     kidney stones x2  . Tuberculosis     tx. for 6 months -no reason given for this.Don't know if Test was Positive or not.  . Complication of anesthesia     12/22 patient states never had problems with anesthesia   Past Surgical History  Procedure Laterality Date  . Eye surgery      laser for eye glaucoma  . Nephrolithotomy Right 09/18/2015    Procedure: RIGHT PERCUTANEOUS NEPHROLITHOTOMY ;  Surgeon: Ihor Gully, MD;  Location: [facility];  Service: Urology;  Laterality: Right;   Social History:  reports that he has never smoked. He does not have any smokeless tobacco history on file. He reports that he drinks about 1.2 - 1.8 oz of alcohol per week. He reports that he does not use illicit drugs.  No Known Allergies  History reviewed. No pertinent family history.  Prior to Admission medications   Medication Sig Start Date End Date Taking? Authorizing Provider  acetaminophen (TYLENOL) 325 MG tablet Take 975 mg by mouth 2 (two) times daily as needed for mild pain or moderate pain.   Yes Historical Provider, MD  HYDROcodone-acetaminophen (NORCO/VICODIN) 5-325 MG tablet Take 1-2 tablets by mouth 2 (two) times daily as needed for moderate pain or severe pain.   Yes Historical Provider, MD  insulin aspart (NOVOLOG) 100 UNIT/ML injection Inject 8 Units into the skin 3 (three) times daily before meals.   Yes Historical Provider, MD  insulin glargine (LANTUS) 100 UNIT/ML injection Inject 15 Units into the skin at bedtime.   Yes Historical Provider, MD  Oxycodone HCl 10 MG TABS Take 1 tablet (10  mg total) by mouth every 4 (four) hours as needed. 09/19/15  Yes Ihor GullyMark Ottelin, MD  phenazopyridine (PYRIDIUM) 200 MG tablet Take 1 tablet (200 mg total) by mouth 3 (three) times daily as needed for pain. 09/19/15  Yes Ihor GullyMark Ottelin, MD   Physical Exam: Filed Vitals:   09/28/15 2245 09/28/15 2300 09/28/15 2315 09/28/15 2330  BP:  142/74 142/73 145/76 139/76  Pulse: 93 91 93 92  Temp:      TempSrc:      Resp: 23 0 24 24  Height:      Weight:      SpO2: 96% 95% 96% 96%    Wt Readings from Last 3 Encounters:  09/28/15 63.504 kg (140 lb)  09/18/15 71.668 kg (158 lb)  09/15/15 71.668 kg (158 lb)    General:  Appears calm and comfortable Eyes: PERRL, normal lids, irises & conjunctiva ENT: grossly normal hearing, lips & tongue Neck: no LAD, masses or thyromegaly Cardiovascular: RRR, no m/r/g. No LE edema. Telemetry: SR, no arrhythmias  Respiratory: CTA bilaterally, no w/r/r. Normal respiratory effort. Abdomen: Bowel sounds positive, soft, mild suprapubic tenderness, no guarding no rebound tenderness. 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 Skin: no rash or induration seen on limited exam Musculoskeletal: grossly normal tone BUE/BLE Psychiatric: grossly normal mood and affect, speech fluent and appropriate Neurologic: grossly non-focal.          Labs on Admission:  Basic Metabolic Panel:  Recent Labs Lab 09/28/15 1646 09/28/15 2009  NA 131* 134*  K 4.3 4.4  CL 95* 98*  CO2 27 26  GLUCOSE 225* 203*  BUN 12 12  CREATININE 1.12 1.06  CALCIUM 9.1 9.5   Liver Function Tests:  Recent Labs Lab 09/28/15 2009  AST 17  ALT 13*  ALKPHOS 79  BILITOT 0.9  PROT 7.6  ALBUMIN 3.7   CBC:  Recent Labs Lab 09/28/15 1646  WBC 17.2*  NEUTROABS 13.3*  HGB 10.6*  HCT 31.9*  MCV 83.5  PLT 363    Radiological Exams on Admission: Ct Abdomen Pelvis W Contrast  09/28/2015  CLINICAL DATA:  Patient status post right renal stone removal and stent placement 09/23/2015. Continued pain and loss of appetite. EXAM: CT ABDOMEN AND PELVIS WITH CONTRAST TECHNIQUE: Multidetector CT imaging of the abdomen and pelvis was performed using the standard protocol following bolus administration of intravenous contrast. CONTRAST:  100mL OMNIPAQUE IOHEXOL 300 MG/ML  SOLN COMPARISON:  CT abdomen pelvis 09/18/2015  FINDINGS: Lower chest: Normal heart size. Dependent atelectasis within the lung bases. Small right pleural effusion. Hepatobiliary: Liver is normal in size and contour. No focal hepatic lesion identified. Gallbladder is unremarkable. No intrahepatic or extrahepatic biliary ductal dilatation. Pancreas: Unremarkable Spleen: Unremarkable Adrenals/Urinary Tract: The adrenal glands are normal. There is a new low-attenuation subcapsular fluid collection about the right kidney measuring up to 7.4 x 2.3 cm. This exerts mass effect on the right renal cortex. The kidneys enhance with contrast. A right-sided double-J nephroureteral stent is in place and appears in appropriate position. There is wall thickening and stranding along the course of the right ureter. There is an 8 mm stone within the interpolar region of the right kidney. There is an additional punctate 2 mm stone within the inferior pole of the right kidney. There is an additional 4 mm stone within the superior pole of the right kidney. Multiple nonobstructing stones are demonstrated within the left kidney with the largest in the inferior pole measuring 8 mm. Stomach/Bowel: No abnormal bowel  wall thickening or evidence for bowel obstruction. No free intraperitoneal air. Normal morphology of the stomach. Vascular/Lymphatic: Peripheral calcified atherosclerotic plaque involving the abdominal aorta. Infrarenal abdominal aortic ectasia measuring 2.3 cm. Additionally there is suggestion of a short-segment chronic dissection within the infrarenal abdominal aorta (image 38; series 2). There is significant atherosclerotic plaque at the origin of the right common iliac artery causing marked stenosis (image 49; series 2). Other: Prostate unremarkable. Musculoskeletal: No aggressive or acute appearing osseous lesions. Lumbar spine degenerative changes. IMPRESSION: Interval development of a large low-attenuation subcapsular fluid collection about the right kidney exerting mass  effect on the right renal parenchyma. This may potentially represent a urinoma or seroma, postprocedural in etiology. An evolved hematoma is additional consideration. Double-J right nephroureteral stent is in place and appears in appropriate position. Wall thickening and surrounding stranding about the course of the right ureter is likely postprocedural in etiology. Recommend correlation with urinalysis to exclude the possibility of ascending infectious process. Bilateral nonobstructing nephrolithiasis. Marked atherosclerotic plaque of the right common iliac artery causes severe stenosis. Infrarenal abdominal aortic ectasia. Short segment infrarenal abdominal dissection, likely chronic given associated calcification. These results were called by telephone at the time of interpretation on 09/28/2015 at 9:18 pm to Dr. Althea Grimmer , who verbally acknowledged these results. Electronically Signed   By: Annia Belt M.D.   On: 09/28/2015 21:30    EKG: Independently reviewed.  Vent. rate 101 BPM PR interval 124 ms QRS duration 86 ms QT/QTc 328/425 ms P-R-T axes 49 62 7 Age not entered, assumed to be 70 years old for purpose of ECG interpretation Sinus tachycardia ST elev, probable normal early repol pattern  Assessment/Plan Principal Problem:   Acute pyelonephritis Admit to MedSurg. Continue IV hydration. Continue IV antibiotics. Follow-up urine culture and sensitivity. Urology evaluation in the morning.  Active Problems:   DM type 2 (diabetes mellitus, type 2) (HCC) Continue long-acting insulin. CBG monitoring with regular insulin sliding scale before meals.   Urology was consulted by the emergency department.  Code Status: Full code. DVT Prophylaxis: SCDs. Family Communication:  Disposition Plan: Admit for IV hydration, pain control and IV antibiotic therapy. Urology consult in a.m.   Time spent: Over 70 minutes were used in the process of this admission.  Bobette Mo [facility] Pager [phone].

## 2015-09-29 NOTE — Consult Note (Signed)
Urology Consult  Referring physician: Dr. Rockne Menghini Reason for referral: Pyelonephritis, recent PCNL  Chief Complaint: Right flank pain  History of Present Illness: Mr Arthur Reed is a 70yo with a hx of nephrolithiasis who is s/p R PCNL over 1 week ago. He was doing well until 2 days ago when he developed worsening right flank pain, nausea, poor appetite. He presented to the ER and on CT scan was found to have a right subcapsular fluid collection consistent with urine. Her has moderate right perinephric stranding. JJ ureterals tent was in good position and the patient had a distended bladder.  Currently he has mild to moderate, dull, constant nonradiating right flank pain and dull mild suprapubic pain. He feels he is emptying his bladder well but does have urgency and dysuria. He is currently taking pyridium which has helped the dysuria. UA is consistent with infection. Culture is pending.  Past Medical History  Diagnosis Date  . Diabetes mellitus without complication ([place])   . History of kidney stones     kidney stones x2  . Tuberculosis     tx. for 6 months -no reason given for this.Don't know if Test was Positive or not.  . Complication of anesthesia     12/22 patient states never had problems with anesthesia  . 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 Dissection of abdominal aorta (HCC), short-segment, chronic, infrarenal 09/29/2015   Past Surgical History  Procedure Laterality Date  . Eye surgery      laser for eye glaucoma  . Nephrolithotomy Right 09/18/2015    Procedure: RIGHT PERCUTANEOUS NEPHROLITHOTOMY ;  Surgeon: Kathie Rhodes, MD;  Location: [facility];  Service: Urology;  Laterality: Right;    Medications: I have reviewed the patient's current medications. Allergies: No Known Allergies  History reviewed. No pertinent family history. Social History:  reports that he has never smoked. He does not have any smokeless tobacco history on file. He reports that he drinks about 1.2 - 1.8  oz of alcohol per week. He reports that he does not use illicit drugs.  Review of Systems  Constitutional: Positive for fever, weight loss and malaise/fatigue.  Gastrointestinal: Positive for nausea.  Genitourinary: Positive for dysuria, urgency, frequency, hematuria and flank pain.  Neurological: Positive for weakness.  All other systems reviewed and are negative.   Physical Exam:  Vital signs in last 24 hours: Temp:  [98.1 F (36.7 C)-100.2 F (37.9 C)] 98.1 F (36.7 C) (01/02 0542) Pulse Rate:  [70-114] 70 (01/02 0542) Resp:  [0-25] 21 (01/02 0542) BP: (122-163)/(58-82) 122/58 mmHg (01/02 0542) SpO2:  [94 %-98 %] 98 % (01/02 0542) Weight:  [63.504 kg (140 lb)-65.545 kg (144 lb 8 oz)] 65.545 kg (144 lb 8 oz) (01/02 0009) Physical Exam  Constitutional: He is oriented to person, place, and time. He appears well-developed and well-nourished.  HENT:  Head: Normocephalic and atraumatic.  Eyes: EOM are normal. Pupils are equal, round, and reactive to light.  Neck: Normal range of motion. No thyromegaly present.  Cardiovascular: Normal rate and regular rhythm.   Respiratory: Effort normal. No respiratory distress.  GI: Soft. He exhibits no distension and no mass. There is no tenderness. There is no rebound and no guarding. Hernia confirmed negative in the right inguinal area and confirmed negative in the left inguinal area.  Genitourinary: Testes normal and penis normal. Right testis shows no mass, no swelling and no tenderness. Right testis is descended. Cremasteric reflex is not absent on the right side. Left testis shows  no mass, no swelling and no tenderness. Left testis is descended. Cremasteric reflex is not absent on the left side. Uncircumcised. No penile tenderness.  Musculoskeletal: Normal range of motion. He exhibits no edema.  Lymphadenopathy:       Right: No inguinal adenopathy present.       Left: No inguinal adenopathy present.  Neurological: He is alert and oriented to  person, place, and time.  Skin: Skin is warm and dry.  Psychiatric: He has a normal mood and affect. His behavior is normal. Judgment and thought content normal.    Laboratory Data:  Results for orders placed or performed during the hospital encounter of 09/28/15 (from the past 72 hour(s))  Basic metabolic panel     Status: Abnormal   Collection Time: 09/28/15  4:46 PM  Result Value Ref Range   Sodium 131 (L) 135 - 145 mmol/L   Potassium 4.3 3.5 - 5.1 mmol/L   Chloride 95 (L) 101 - 111 mmol/L   CO2 27 22 - 32 mmol/L   Glucose, Bld 225 (H) 65 - 99 mg/dL   BUN 12 6 - 20 mg/dL   Creatinine, Ser 6.74 0.61 - 1.24 mg/dL   Calcium 9.1 8.9 - 94.5 mg/dL   GFR calc non Af Amer >60 >60 mL/min   GFR calc Af Amer >60 >60 mL/min    Comment: (NOTE) The eGFR has been calculated using the CKD EPI equation. This calculation has not been validated in all clinical situations. eGFR's persistently <60 mL/min signify possible Chronic Kidney Disease.    Anion gap 9 5 - 15  CBC     Status: Abnormal   Collection Time: 09/28/15  4:46 PM  Result Value Ref Range   WBC 17.2 (H) 4.0 - 10.5 K/uL   RBC 3.82 (L) 4.22 - 5.81 MIL/uL   Hemoglobin 10.6 (L) 13.0 - 17.0 g/dL   HCT 64.1 (L) 60.0 - 02.9 %   MCV 83.5 78.0 - 100.0 fL   MCH 27.7 26.0 - 34.0 pg   MCHC 33.2 30.0 - 36.0 g/dL   RDW 70.4 91.1 - 43.5 %   Platelets 363 150 - 400 K/uL  Differential     Status: Abnormal   Collection Time: 09/28/15  4:46 PM  Result Value Ref Range   Neutrophils Relative % 80 %   Neutro Abs 13.3 (H) 1.7 - 7.7 K/uL   Lymphocytes Relative 11 %   Lymphs Abs 1.8 0.7 - 4.0 K/uL   Monocytes Relative 9 %   Monocytes Absolute 1.5 (H) 0.1 - 1.0 K/uL   Eosinophils Relative 0 %   Eosinophils Absolute 0.0 0.0 - 0.7 K/uL   Basophils Relative 0 %   Basophils Absolute 0.0 0.0 - 0.1 K/uL  Comprehensive metabolic panel     Status: Abnormal   Collection Time: 09/28/15  8:09 PM  Result Value Ref Range   Sodium 134 (L) 135 - 145 mmol/L    Potassium 4.4 3.5 - 5.1 mmol/L   Chloride 98 (L) 101 - 111 mmol/L   CO2 26 22 - 32 mmol/L   Glucose, Bld 203 (H) 65 - 99 mg/dL   BUN 12 6 - 20 mg/dL   Creatinine, Ser 5.11 0.61 - 1.24 mg/dL   Calcium 9.5 8.9 - 61.3 mg/dL   Total Protein 7.6 6.5 - 8.1 g/dL   Albumin 3.7 3.5 - 5.0 g/dL   AST 17 15 - 41 U/L   ALT 13 (L) 17 - 63 U/L   Alkaline Phosphatase  79 38 - 126 U/L   Total Bilirubin 0.9 0.3 - 1.2 mg/dL   GFR calc non Af Amer >60 >60 mL/min   GFR calc Af Amer >60 >60 mL/min    Comment: (NOTE) The eGFR has been calculated using the CKD EPI equation. This calculation has not been validated in all clinical situations. eGFR's persistently <60 mL/min signify possible Chronic Kidney Disease.    Anion gap 10 5 - 15  I-Stat CG4 Lactic Acid, ED  (not at  [facility])     Status: None   Collection Time: 09/28/15  8:35 PM  Result Value Ref Range   Lactic Acid, Venous 0.99 0.5 - 2.0 mmol/L  Urinalysis, Routine w reflex microscopic-may I&O cath if menses (not at [facility])     Status: Abnormal   Collection Time: 09/28/15  9:46 PM  Result Value Ref Range   Color, Urine ORANGE (A) YELLOW    Comment: BIOCHEMICALS MAY BE AFFECTED BY COLOR   APPearance CLEAR CLEAR   Specific Gravity, Urine 1.046 (H) 1.005 - 1.030   pH 7.0 5.0 - 8.0   Glucose, UA 100 (A) NEGATIVE mg/dL   Hgb urine dipstick MODERATE (A) NEGATIVE   Bilirubin Urine SMALL (A) NEGATIVE   Ketones, ur 40 (A) NEGATIVE mg/dL   Protein, ur 30 (A) NEGATIVE mg/dL   Nitrite POSITIVE (A) NEGATIVE   Leukocytes, UA SMALL (A) NEGATIVE  Urine microscopic-add on     Status: Abnormal   Collection Time: 09/28/15  9:46 PM  Result Value Ref Range   Squamous Epithelial / LPF NONE SEEN NONE SEEN   WBC, UA 0-5 0 - 5 WBC/hpf   RBC / HPF 6-30 0 - 5 RBC/hpf   Bacteria, UA RARE (A) NONE SEEN  I-Stat CG4 Lactic Acid, ED  (not at  [facility])     Status: None   Collection Time: 09/28/15 10:59 PM  Result Value Ref Range   Lactic Acid, Venous 0.80 0.5 - 2.0 mmol/L   Glucose, capillary     Status: Abnormal   Collection Time: 09/29/15 12:40 AM  Result Value Ref Range   Glucose-Capillary 170 (H) 65 - 99 mg/dL  Magnesium     Status: None   Collection Time: 09/29/15  5:21 AM  Result Value Ref Range   Magnesium 2.1 1.7 - 2.4 mg/dL  Phosphorus     Status: Abnormal   Collection Time: 09/29/15  5:21 AM  Result Value Ref Range   Phosphorus 2.1 (L) 2.5 - 4.6 mg/dL  CBC WITH DIFFERENTIAL     Status: Abnormal   Collection Time: 09/29/15  5:27 AM  Result Value Ref Range   WBC 13.8 (H) 4.0 - 10.5 K/uL   RBC 3.39 (L) 4.22 - 5.81 MIL/uL   Hemoglobin 9.3 (L) 13.0 - 17.0 g/dL   HCT 28.5 (L) 39.0 - 52.0 %   MCV 84.1 78.0 - 100.0 fL   MCH 27.4 26.0 - 34.0 pg   MCHC 32.6 30.0 - 36.0 g/dL   RDW 12.7 11.5 - 15.5 %   Platelets 312 150 - 400 K/uL   Neutrophils Relative % 77 %   Neutro Abs 10.7 (H) 1.7 - 7.7 K/uL   Lymphocytes Relative 14 %   Lymphs Abs 1.9 0.7 - 4.0 K/uL   Monocytes Relative 8 %   Monocytes Absolute 1.1 (H) 0.1 - 1.0 K/uL   Eosinophils Relative 1 %   Eosinophils Absolute 0.1 0.0 - 0.7 K/uL   Basophils Relative 0 %   Basophils Absolute  0.0 0.0 - 0.1 K/uL  Comprehensive metabolic panel     Status: Abnormal   Collection Time: 09/29/15  5:27 AM  Result Value Ref Range   Sodium 134 (L) 135 - 145 mmol/L   Potassium 4.0 3.5 - 5.1 mmol/L   Chloride 98 (L) 101 - 111 mmol/L   CO2 27 22 - 32 mmol/L   Glucose, Bld 171 (H) 65 - 99 mg/dL   BUN 11 6 - 20 mg/dL   Creatinine, Ser 1.10 0.61 - 1.24 mg/dL   Calcium 8.6 (L) 8.9 - 10.3 mg/dL   Total Protein 6.4 (L) 6.5 - 8.1 g/dL   Albumin 3.0 (L) 3.5 - 5.0 g/dL   AST 14 (L) 15 - 41 U/L   ALT 11 (L) 17 - 63 U/L   Alkaline Phosphatase 71 38 - 126 U/L   Total Bilirubin 1.1 0.3 - 1.2 mg/dL   GFR calc non Af Amer >60 >60 mL/min   GFR calc Af Amer >60 >60 mL/min    Comment: (NOTE) The eGFR has been calculated using the CKD EPI equation. This calculation has not been validated in all clinical  situations. eGFR's persistently <60 mL/min signify possible Chronic Kidney Disease.    Anion gap 9 5 - 15  Glucose, capillary     Status: Abnormal   Collection Time: 09/29/15  7:52 AM  Result Value Ref Range   Glucose-Capillary 131 (H) 65 - 99 mg/dL   No results found for this or any previous visit (from the past 240 hour(s)). Creatinine:  Recent Labs  09/28/15 1646 09/28/15 2009 09/29/15 0527  CREATININE 1.12 1.06 1.10   Baseline Creatinine: 1  Impression/Assessment:  69yo with nephrolithiasis, pyelonephritis, subcapsular fluid collection  Plan:  Recs: 1. Please measure PVR. If PVR remains elevated (>300cc) please place indwelling foley catheter.  2. Flomax daily for stent discomfort and LUTS 3. Continue broad spectrum antibiotics pending urine culture. Pt will need a total of 14 days of culture specific antibiotics 4. Reimage subcapcular fluid collection in 48 hours with renal US  MCKENZIE, PATRICK L 09/29/2015, 10:02 AM     UA

## 2015-09-30 LAB — BASIC METABOLIC PANEL
Anion gap: 5 (ref 5–15)
BUN: 12 mg/dL (ref 6–20)
CALCIUM: 8.7 mg/dL — AB (ref 8.9–10.3)
CO2: 27 mmol/L (ref 22–32)
CREATININE: 0.89 mg/dL (ref 0.61–1.24)
Chloride: 105 mmol/L (ref 101–111)
GFR calc Af Amer: >60 mL/min (ref >60)
Glucose, Bld: 149 mg/dL — ABNORMAL HIGH (ref 65–99)
Potassium: 3.9 mmol/L (ref 3.5–5.1)
Sodium: 137 mmol/L (ref 135–145)

## 2015-09-30 LAB — GLUCOSE, CAPILLARY: Glucose-Capillary: 137 mg/dL — ABNORMAL HIGH (ref 65–99)

## 2015-09-30 LAB — CBC
HEMATOCRIT: 27.4 % — AB (ref 39.0–52.0)
Hemoglobin: 9 g/dL — ABNORMAL LOW (ref 13.0–17.0)
MCH: 28 pg (ref 26.0–34.0)
MCHC: 32.8 g/dL (ref 30.0–36.0)
MCV: 85.1 fL (ref 78.0–100.0)
PLATELETS: 371 10*3/uL (ref 150–400)
RBC: 3.22 MIL/uL — ABNORMAL LOW (ref 4.22–5.81)
RDW: 12.7 % (ref 11.5–15.5)
WBC: 9.6 10*3/uL (ref 4.0–10.5)

## 2015-09-30 LAB — URINE CULTURE: Culture: NO GROWTH

## 2015-09-30 MED ORDER — TAMSULOSIN HCL 0.4 MG PO CAPS: 0.4000 mg | ORAL_CAPSULE | Freq: Every day | ORAL | Status: DC

## 2015-09-30 MED ORDER — OXYCODONE HCL 10 MG PO TABS
10.0000 mg | ORAL_TABLET | ORAL | Status: DC | PRN
Start: 2015-09-30 — End: 2024-07-10

## 2015-09-30 MED ORDER — ENSURE ENLIVE PO LIQD: 237.0000 mL | Freq: Two times a day (BID) | ORAL | Status: DC

## 2015-09-30 NOTE — Progress Notes (Signed)
Pt discharged from the unit via wheelchair. Discharge instructions were reviewed with the pt prior to discharge. Letter of absence for work and prescriptions were given to the pt prior to discharge. No questions or concerns from the pt or family at this time.  Ziair Penson W Cambree Hendrix , RN

## 2015-09-30 NOTE — Discharge Summary (Signed)
Physician Discharge Summary  Arthur Reed QMV:[id number] DOB: 06-30-1946 DOA: 09/28/2015  PCP: No PCP Per Patient  Admit date: 09/28/2015 Discharge date: 09/30/2015   Recommendations for Outpatient Follow-Up:   1. Patient will follow-up with his urologist.   Discharge Diagnosis:   Principal Problem:    Acute pyelonephritis Active Problems:    DM type 2 (diabetes mellitus, type 2) (HCC)    Nephrolithiasis    Iliac artery stenosis, right (HCC)    Dissection of abdominal aorta (HCC), short-segment, chronic, infrarenal    Pyelonephritis    Renal hematoma, right   Discharge disposition:  Home.    Discharge Condition: Improved.  Diet recommendation: Low sodium, heart healthy.  Carbohydrate-modified.    History of Present Illness:   Arthur Reed is an 70 y.o. male with a PMH of type 2 diabetes, urolithiasis, s/p percutaneous nephrolithotomy on 09/18/15 by Dr. Ihor Gully who was admitted 09/28/15 with complaints of right flank pain, dysuria, intermittent hematuria, chills, fatigue, suprapubic discomfort and decreased appetite. When seen in the emergency department, the patient stated that he was feeling better. The case was discussed by the emergency department medical staff with urology who recommended admission and treatment for acute pyelonephritis.  Hospital Course by Problem:   Principal Problem:  Acute pyelonephritis in the setting of nephrolithiasis and recent instrumentation with right double J stent - Treated with empiric Vancomycin/Zosyn, discontinued when urine cultures were negative. - Cleared for discharge by urologist. - Continue Pyridium.  Active Problems:  Dissection of abdominal aorta (HCC), short-segment, chronic, infrarenal/Right iliac artery stenosis - Needs good BP and lipid control.   DM type 2 (diabetes mellitus, type 2) (HCC) - Currently being managed with Lantus 15 units daily and insulin resistant SSI TID.  - Home regimen resumed  at discharge.  Medical Consultants:    Urology   Discharge Exam:   Filed Vitals:   09/29/15 2212 09/30/15 0718  BP: 142/70 124/58  Pulse: 86 72  Temp: 99 F (37.2 C) 98.5 F (36.9 C)  Resp: 20 19   Filed Vitals:   09/29/15 0542 09/29/15 1426 09/29/15 2212 09/30/15 0718  BP: 122/58 126/62 142/70 124/58  Pulse: 70 80 86 72  Temp: 98.1 F (36.7 C) 97.8 F (36.6 C) 99 F (37.2 C) 98.5 F (36.9 C)  TempSrc: Oral Oral Oral Oral  Resp: 21 20 20 19   Height:      Weight:      SpO2: 98% 100% 100% 100%    Gen:  NAD Cardiovascular:  RRR, No M/R/G Respiratory: Lungs CTAB Gastrointestinal: Abdomen soft, NT/ND with normal active bowel sounds. Extremities: No C/E/C   The results of significant diagnostics from this hospitalization (including imaging, microbiology, ancillary and laboratory) are listed below for reference.     Procedures and Diagnostic Studies:   Ct Abdomen Pelvis W Contrast  09/28/2015  CLINICAL DATA:  Patient status post right renal stone removal and stent placement 09/23/2015. Continued pain and loss of appetite. EXAM: CT ABDOMEN AND PELVIS WITH CONTRAST TECHNIQUE: Multidetector CT imaging of the abdomen and pelvis was performed using the standard protocol following bolus administration of intravenous contrast. CONTRAST:  OMNIPAQUE IOHEXOL 300 MG/ML  SOLN COMPARISON:  CT abdomen pelvis 09/18/2015 FINDINGS: Lower chest: Normal heart size. Dependent atelectasis within the lung bases. Small right pleural effusion. Hepatobiliary: Liver is normal in size and contour. No focal hepatic lesion identified. Gallbladder is unremarkable. No intrahepatic or extrahepatic biliary ductal dilatation. Pancreas: Unremarkable Spleen: Unremarkable Adrenals/Urinary Tract: The adrenal glands are  normal. There is a new low-attenuation subcapsular fluid collection about the right kidney measuring up to 7.4 x 2.3 cm. This exerts mass effect on the right renal cortex. The kidneys enhance  with contrast. A right-sided double-J nephroureteral stent is in place and appears in appropriate position. There is wall thickening and stranding along the course of the right ureter. There is an 8 mm stone within the interpolar region of the right kidney. There is an additional punctate 2 mm stone within the inferior pole of the right kidney. There is an additional 4 mm stone within the superior pole of the right kidney. Multiple nonobstructing stones are demonstrated within the left kidney with the largest in the inferior pole measuring 8 mm. Stomach/Bowel: No abnormal bowel wall thickening or evidence for bowel obstruction. No free intraperitoneal air. Normal morphology of the stomach. Vascular/Lymphatic: Peripheral calcified atherosclerotic plaque involving the abdominal aorta. Infrarenal abdominal aortic ectasia measuring 2.3 cm. Additionally there is suggestion of a short-segment chronic dissection within the infrarenal abdominal aorta (image 38; series 2). There is significant atherosclerotic plaque at the origin of the right common iliac artery causing marked stenosis (image 49; series 2). Other: Prostate unremarkable. Musculoskeletal: No aggressive or acute appearing osseous lesions. Lumbar spine degenerative changes. IMPRESSION: Interval development of a large low-attenuation subcapsular fluid collection about the right kidney exerting mass effect on the right renal parenchyma. This may potentially represent a urinoma or seroma, postprocedural in etiology. An evolved hematoma is additional consideration. Double-J right nephroureteral stent is in place and appears in appropriate position. Wall thickening and surrounding stranding about the course of the right ureter is likely postprocedural in etiology. Recommend correlation with urinalysis to exclude the possibility of ascending infectious process. Bilateral nonobstructing nephrolithiasis. Marked atherosclerotic plaque of the right common iliac artery  causes severe stenosis. Infrarenal abdominal aortic ectasia. Short segment infrarenal abdominal dissection, likely chronic given associated calcification. These results were called by telephone at the time of interpretation on 09/28/2015 at 9:18 pm to Dr. Althea Grimmer , who verbally acknowledged these results. Electronically Signed   By: Annia Belt M.D.   On: 09/28/2015 21:30   US Renal  09/29/2015  CLINICAL DATA:  Status post right percutaneous nephrolithotomy on 09/18/2015. Admitted with right flank pain, nausea and poor appetite. CT is demonstrated a right subcapsular renal fluid collection. Followup is performed. EXAM: RENAL / URINARY TRACT ULTRASOUND COMPLETE COMPARISON:  CT of the abdomen and pelvis on 09/28/2015 as well as a prior unenhanced scan immediately following surgery on 09/18/2015. FINDINGS: Right Kidney: Length: 13 cm. Small residual shadowing calculi present which are not causing obstruction. There is no evidence of hydronephrosis. Lentiform shaped lateral subcapsular fluid collection appears similar to the CT study with maximal thickness of approximately 2 cm. This is largely anechoic with some mild internal echogenicity present. Evolving hematoma is favored over urinoma. Infected fluid cannot be excluded by sonographic appearance. Left Kidney: Length: 13 cm. No hydronephrosis. Nonobstructing calculus noted in the lower pole. Bladder: Appears normal for degree of bladder distention. IMPRESSION: Size of the lateral subcapsular fluid collection of the right kidney appears stable compared to the CT study yesterday. Based on sonographic appearance, this is mildly complex in appearance. Evolving subcapsular hematoma is favored. This would be a somewhat unusual appearance for a a urinoma, but urinoma remains in the differential. Secondary infection cannot be excluded by sonographic appearance. Electronically Signed   By: Irish Lack M.D.   On: 09/29/2015 12:35     Labs:  Basic Metabolic  Panel:  Recent Labs Lab 09/28/15 1646 09/28/15 2009 09/29/15 0521 09/29/15 0527 09/30/15 0540  NA 131* 134*  --  134* 137  K 4.3 4.4  --  4.0 3.9  CL 95* 98*  --  98* 105  CO2 27 26  --  27 27  GLUCOSE 225* 203*  --  171* 149*  BUN 12 12  --  11 12  CREATININE 1.12 1.06  --  1.10 0.89  CALCIUM 9.1 9.5  --  8.6* 8.7*  MG  --   --  2.1  --   --   PHOS  --   --  2.1*  --   --    GFR Estimated Creatinine Clearance: 73.6 mL/min (by C-G formula based on Cr of 0.89). Liver Function Tests:  Recent Labs Lab 09/28/15 2009 09/29/15 0527  AST 17 14*  ALT 13* 11*  ALKPHOS 79 71  BILITOT 0.9 1.1  PROT 7.6 6.4*  ALBUMIN 3.7 3.0*    CBC:  Recent Labs Lab 09/28/15 1646 09/29/15 0527 09/30/15 0540  WBC 17.2* 13.8* 9.6  NEUTROABS 13.3* 10.7*  --   HGB 10.6* 9.3* 9.0*  HCT 31.9* 28.5* 27.4*  MCV 83.5 84.1 85.1  PLT 363 312 371   CBG:  Recent Labs Lab 09/29/15 0752 09/29/15 1235 09/29/15 1644 09/29/15 2205 09/30/15 0750  GLUCAP 131* 185* 161* 206* 137*   Microbiology Recent Results (from the past 240 hour(s))  Blood Culture (routine x 2)     Status: None (Preliminary result)   Collection Time: 09/28/15  8:09 PM  Result Value Ref Range Status   Specimen Description BLOOD RIGHT ANTECUBITAL  Final   Special Requests BOTTLES DRAWN AEROBIC AND ANAEROBIC 5CC  Final   Culture   Final    NO GROWTH 1 DAY Performed at [facility]    Report Status PENDING  Incomplete  Blood Culture (routine x 2)     Status: None (Preliminary result)   Collection Time: 09/28/15  8:15 PM  Result Value Ref Range Status   Specimen Description BLOOD BLOOD RIGHT HAND  Final   Special Requests BOTTLES DRAWN AEROBIC AND ANAEROBIC 5CC  Final   Culture   Final    NO GROWTH 1 DAY Performed at [facility]    Report Status PENDING  Incomplete  Urine C&S     Status: None   Collection Time: 09/28/15  9:46 PM  Result Value Ref Range Status   Specimen Description URINE, RANDOM   Final   Special Requests NONE  Final   Culture   Final    NO GROWTH 2 DAYS Performed at [facility]    Report Status 09/30/2015 FINAL  Final     Discharge Instructions:   Discharge Instructions    Call MD for:  extreme fatigue    Complete by:  As directed      Call MD for:  persistant nausea and vomiting    Complete by:  As directed      Call MD for:  severe uncontrolled pain    Complete by:  As directed      Call MD for:  temperature >100.4    Complete by:  As directed      Diet Carb Modified    Complete by:  As directed      Increase activity slowly    Complete by:  As directed      Other Restrictions    Complete by:  As directed   09/30/2015    Re: Arthur Reed    The above named individual was in the hospital from 09/28/2015-09/30/2015 under my care.  He is medically clear to return to work on 10/10/15.     Dr. Trula Orehristina Rama  [facility]            Medication List    TAKE these medications        acetaminophen 325 MG tablet  Commonly known as:  TYLENOL  Take 975 mg by mouth 2 (two) times daily as needed for mild pain or moderate pain.     feeding supplement (ENSURE ENLIVE) Liqd  Take 237 mLs by mouth 2 (two) times daily between meals.     HYDROcodone-acetaminophen 5-325 MG tablet  Commonly known as:  NORCO/VICODIN  Take 1-2 tablets by mouth 2 (two) times daily as needed for moderate pain or severe pain.     insulin aspart 100 UNIT/ML injection  Commonly known as:  novoLOG  Inject 8 Units into the skin 3 (three) times daily before meals.     insulin glargine 100 UNIT/ML injection  Commonly known as:  LANTUS  Inject 15 Units into the skin at bedtime.     Oxycodone HCl 10 MG Tabs  Take 1 tablet (10 mg total) by mouth every 4 (four) hours as needed.     phenazopyridine 200 MG tablet  Commonly known as:  PYRIDIUM  Take 1 tablet (200 mg total) by mouth 3 (three) times daily as needed for pain.     tamsulosin 0.4 MG Caps capsule   Commonly known as:  FLOMAX  Take 1 capsule (0.4 mg total) by mouth daily.           Follow-up Information    Call Garnett FarmTTELIN,MARK C, MD.   Specialty:  Urology   Why:  For an appointment in 1-2 weeks when you get home.   Contact information:   [address] [place] [address] [phone]        Time coordinating discharge: 35 minutes.  Signed:  RAMA,CHRISTINA  Pager [phone] [facility] 09/30/2015, 3:09 PM

## 2015-09-30 NOTE — Progress Notes (Signed)
Patient ID: Arthur HawthorneClarence Reed, male   DOB: June 11, 1946, 70 y.o.   MRN: [id number]  Subjective: Patient reports that he is feeling much better.  His flank pain has improved.  Objective: Vital signs in last 24 hours: Temp:  [97.8 F (36.6 C)-99 F (37.2 C)] 98.5 F (36.9 C) (01/03 0718) Pulse Rate:  [72-86] 72 (01/03 0718) Resp:  [19-20] 19 (01/03 0718) BP: (124-142)/(58-70) 124/58 mmHg (01/03 0718) SpO2:  [100 %] 100 % (01/03 0718)A  Intake/Output from previous day: 01/02 0701 - 01/03 0700 In: 3 [I.V.:3] Out: 200 [Urine:200] Intake/Output this shift:    Past Medical History  Diagnosis Date  . Diabetes mellitus without complication (HCC)   . History of kidney stones     kidney stones x2  . Tuberculosis     tx. for 6 months -no reason given for this.Don't know if Test was Positive or not.  . Complication of anesthesia     12/22 patient states never had problems with anesthesia  . Iliac artery stenosis, right (HCC) 09/29/2015  . Dissection of abdominal aorta (HCC), short-segment, chronic, infrarenal 09/29/2015    Physical Exam:  Lungs - Normal respiratory effort, chest expands symmetrically.  Abdomen - Soft, non-tender & non-distended. Little to no right CVAT  Lab Results:  Recent Labs  09/28/15 1646 09/29/15 0527 09/30/15 0540  WBC 17.2* 13.8* 9.6  HGB 10.6* 9.3* 9.0*  HCT 31.9* 28.5* 27.4*   BMET  Recent Labs  09/29/15 0527 09/30/15 0540  NA 134* 137  K 4.0 3.9  CL 98* 105  CO2 27 27  GLUCOSE 171* 149*  BUN 11 12  CREATININE 1.10 0.89  CALCIUM 8.6* 8.7*   No results for input(s): LABURIN in the last 72 hours. No results found for this or any previous visit.  Studies/Results: [place] Renal  09/29/2015  CLINICAL DATA:  Status post right percutaneous nephrolithotomy on 09/18/2015. Admitted with right flank pain, nausea and poor appetite. CT is demonstrated a right subcapsular renal fluid collection. Followup is performed. EXAM: RENAL / URINARY TRACT ULTRASOUND  COMPLETE COMPARISON:  CT of the abdomen and pelvis on 09/28/2015 as well as a prior unenhanced scan immediately following surgery on 09/18/2015. FINDINGS: Right Kidney: Length: 13 cm. Small residual shadowing calculi present which are not causing obstruction. There is no evidence of hydronephrosis. Lentiform shaped lateral subcapsular fluid collection appears similar to the CT study with maximal thickness of approximately 2 cm. This is largely anechoic with some mild internal echogenicity present. Evolving hematoma is favored over urinoma. Infected fluid cannot be excluded by sonographic appearance. Left Kidney: Length: 13 cm. No hydronephrosis. Nonobstructing calculus noted in the lower pole. Bladder: Appears normal for degree of bladder distention. IMPRESSION: Size of the lateral subcapsular fluid collection of the right kidney appears stable compared to the CT study yesterday. Based on sonographic appearance, this is mildly complex in appearance. Evolving subcapsular hematoma is favored. This would be a somewhat unusual appearance for a a urinoma, but urinoma remains in the differential. Secondary infection cannot be excluded by sonographic appearance. Electronically Signed   By: Irish LackGlenn  Yamagata M.D.   On: 09/29/2015 12:35    Assessment: Right perinephric hematoma - his postoperative CT scan revealed no evidence of hematoma.  This appears to have developed after discharge from the hospital and is likely the cause of his right flank pain.  It has remained stable.  There is no evidence of further active bleeding.  There is also nothing to suggest that this is an infected  hematoma.  Right pyelonephritis - He did have an elevated white blood cell count and right flank pain on admission.  He has a stent in place which certainly could allow bacteria greater ease of access to his kidney.  While the culture remains pending his urine did not appear infected to me with few white cells and nitrite positivity secondary  to Pyridium staining resulting in a false positive.  His urinary symptoms may be in part secondary to his stent.  He remains on broad-spectrum antibiotics with normalization of his white blood cell count and his urine culture remains pending.  Right renal calculi - we discussed the options for management of his remaining right renal calculi.  We discussed ureteroscopy and laser lithotripsy versus nonoperative management with metabolic evaluation to determine his risk factors for stone formation and addressing these medically.  Plan:  1.  Continue antibiotics while awaiting culture results 2.  He appears to be ready for discharge from a urologic standpoint once his culture results have returned. 3.  He will follow-up with me as an outpatient for stent removal.  Bucky Grigg C 09/30/2015, 7:28 AM

## 2015-10-04 LAB — CULTURE, BLOOD (ROUTINE X 2)
CULTURE: NO GROWTH
Culture: NO GROWTH

## 2015-10-14 ENCOUNTER — Other Ambulatory Visit: Payer: Self-pay | Admitting: Urology

## 2015-10-22 ENCOUNTER — Encounter (HOSPITAL_BASED_OUTPATIENT_CLINIC_OR_DEPARTMENT_OTHER): Payer: Self-pay | Admitting: *Deleted

## 2015-10-24 ENCOUNTER — Encounter (HOSPITAL_BASED_OUTPATIENT_CLINIC_OR_DEPARTMENT_OTHER): Payer: Self-pay | Admitting: *Deleted

## 2015-10-28 ENCOUNTER — Encounter (HOSPITAL_BASED_OUTPATIENT_CLINIC_OR_DEPARTMENT_OTHER): Payer: Self-pay | Admitting: *Deleted

## 2015-10-28 NOTE — Progress Notes (Signed)
SPOKE W/ PT'S WIFE.  NPO AFTER MN.  ARRIVE AT 0600.  NEEDS ISTAT AND KUB. CURRENT EKG IN CHART AND EPIC.  MAY TAKE OXYCODONE IF NEEDED W/ DIPS OF WATER AM DOS.

## 2015-11-03 ENCOUNTER — Encounter (HOSPITAL_BASED_OUTPATIENT_CLINIC_OR_DEPARTMENT_OTHER): Payer: Self-pay | Admitting: *Deleted

## 2015-11-03 NOTE — Progress Notes (Addendum)
NPO AFTER MN.  ARRIVE AT 0600.  NEEDS ISTAT AND KUB.  CURRENT EKG IN CHART AND EPIC.  MAY TAKE OXYCODONE AM DOS W/ SIPS OF WATER .

## 2015-11-07 ENCOUNTER — Ambulatory Visit (HOSPITAL_COMMUNITY): Payer: No Typology Code available for payment source

## 2015-11-07 ENCOUNTER — Ambulatory Visit (HOSPITAL_BASED_OUTPATIENT_CLINIC_OR_DEPARTMENT_OTHER)
Admission: RE | Admit: 2015-11-07 | Discharge: 2015-11-07 | Disposition: A | Discharge status: Home / Self Care | Payer: No Typology Code available for payment source | Source: Ambulatory Visit | Attending: Urology | Admitting: Urology

## 2015-11-07 ENCOUNTER — Ambulatory Visit (HOSPITAL_BASED_OUTPATIENT_CLINIC_OR_DEPARTMENT_OTHER): Payer: No Typology Code available for payment source | Admitting: Anesthesiology

## 2015-11-07 ENCOUNTER — Encounter (HOSPITAL_BASED_OUTPATIENT_CLINIC_OR_DEPARTMENT_OTHER)
Admission: RE | Disposition: A | Discharge status: Home / Self Care | Payer: Self-pay | Source: Ambulatory Visit | Attending: Urology

## 2015-11-07 ENCOUNTER — Encounter (HOSPITAL_BASED_OUTPATIENT_CLINIC_OR_DEPARTMENT_OTHER): Payer: Self-pay | Admitting: *Deleted

## 2015-11-07 DIAGNOSIS — Z794 Long term (current) use of insulin: Secondary | ICD-10-CM | POA: Insufficient documentation

## 2015-11-07 DIAGNOSIS — N200 Calculus of kidney: Secondary | ICD-10-CM | POA: Insufficient documentation

## 2015-11-07 DIAGNOSIS — Z87891 Personal history of nicotine dependence: Secondary | ICD-10-CM | POA: Diagnosis not present

## 2015-11-07 DIAGNOSIS — D649 Anemia, unspecified: Secondary | ICD-10-CM | POA: Diagnosis not present

## 2015-11-07 DIAGNOSIS — E119 Type 2 diabetes mellitus without complications: Secondary | ICD-10-CM | POA: Insufficient documentation

## 2015-11-07 DIAGNOSIS — S37019A Minor contusion of unspecified kidney, initial encounter: Secondary | ICD-10-CM | POA: Diagnosis not present

## 2015-11-07 DIAGNOSIS — I739 Peripheral vascular disease, unspecified: Secondary | ICD-10-CM | POA: Diagnosis not present

## 2015-11-07 DIAGNOSIS — X58XXXA Exposure to other specified factors, initial encounter: Secondary | ICD-10-CM | POA: Diagnosis not present

## 2015-11-07 DIAGNOSIS — Z87442 Personal history of urinary calculi: Secondary | ICD-10-CM | POA: Diagnosis not present

## 2015-11-07 HISTORY — DX: Unspecified abdominal pain: R10.9

## 2015-11-07 HISTORY — DX: Dysuria: R30.0

## 2015-11-07 HISTORY — DX: Type 2 diabetes mellitus without complications: E11.9

## 2015-11-07 HISTORY — DX: Personal history of other diseases of urinary system: Z87.448

## 2015-11-07 HISTORY — DX: Flank pain, right side: R10.A1

## 2015-11-07 HISTORY — DX: Calculus of kidney: N20.0

## 2015-11-07 HISTORY — PX: CYSTOSCOPY WITH URETEROSCOPY AND STENT PLACEMENT: SHX6377

## 2015-11-07 HISTORY — DX: Personal history of tuberculosis: Z86.11

## 2015-11-07 HISTORY — DX: Presence of spectacles and contact lenses: Z97.3

## 2015-11-07 HISTORY — PX: HOLMIUM LASER APPLICATION: SHX5852

## 2015-11-07 HISTORY — DX: Complete loss of teeth, unspecified cause, unspecified class: Z97.2

## 2015-11-07 HISTORY — DX: Personal history of urinary (tract) infections: Z87.440

## 2015-11-07 HISTORY — DX: Complete loss of teeth, unspecified cause, unspecified class: K08.109

## 2015-11-07 LAB — POCT I-STAT 4, (NA,K, GLUC, HGB,HCT)
Glucose, Bld: 193 mg/dL — ABNORMAL HIGH (ref 65–99)
HCT: 38 % — ABNORMAL LOW (ref 39.0–52.0)
Hemoglobin: 12.9 g/dL — ABNORMAL LOW (ref 13.0–17.0)
Potassium: 4 mmol/L (ref 3.5–5.1)
Sodium: 140 mmol/L (ref 135–145)

## 2015-11-07 LAB — GLUCOSE, CAPILLARY: Glucose-Capillary: 180 mg/dL — ABNORMAL HIGH (ref 65–99)

## 2015-11-07 SURGERY — CYSTOURETEROSCOPY, WITH STENT INSERTION
Anesthesia: General | Site: Ureter | Laterality: Right

## 2015-11-07 MED ORDER — ONDANSETRON HCL 4 MG/2ML IJ SOLN
4.0000 mg | Freq: Once | INTRAMUSCULAR | Status: DC | PRN
  Filled 2015-11-07: qty 2

## 2015-11-07 MED ORDER — PHENAZOPYRIDINE HCL 100 MG PO TABS
ORAL_TABLET | ORAL | Status: AC
  Filled 2015-11-07: qty 2

## 2015-11-07 MED ORDER — PROPOFOL 10 MG/ML IV BOLUS
INTRAVENOUS | Status: DC | PRN
  Administered 2015-11-07: 160 mg via INTRAVENOUS
  Administered 2015-11-07: 40 mg via INTRAVENOUS

## 2015-11-07 MED ORDER — ONDANSETRON HCL 4 MG/2ML IJ SOLN
INTRAMUSCULAR | Status: AC
  Filled 2015-11-07: qty 2

## 2015-11-07 MED ORDER — FENTANYL CITRATE (PF) 100 MCG/2ML IJ SOLN
INTRAMUSCULAR | Status: AC
  Filled 2015-11-07: qty 2

## 2015-11-07 MED ORDER — CIPROFLOXACIN IN D5W 400 MG/200ML IV SOLN
INTRAVENOUS | Status: AC
  Filled 2015-11-07: qty 200

## 2015-11-07 MED ORDER — PHENAZOPYRIDINE HCL 200 MG PO TABS
200.0000 mg | ORAL_TABLET | Freq: Three times a day (TID) | ORAL | Status: DC
  Administered 2015-11-07: 200 mg via ORAL
  Filled 2015-11-07: qty 1

## 2015-11-07 MED ORDER — ONDANSETRON HCL 4 MG/2ML IJ SOLN
INTRAMUSCULAR | Status: DC | PRN
  Administered 2015-11-07: 4 mg via INTRAVENOUS

## 2015-11-07 MED ORDER — LIDOCAINE HCL (CARDIAC) 20 MG/ML IV SOLN
INTRAVENOUS | Status: AC
  Filled 2015-11-07: qty 5

## 2015-11-07 MED ORDER — FENTANYL CITRATE (PF) 100 MCG/2ML IJ SOLN
INTRAMUSCULAR | Status: DC | PRN
  Administered 2015-11-07: 50 ug via INTRAVENOUS
  Administered 2015-11-07 (×2): 25 ug via INTRAVENOUS

## 2015-11-07 MED ORDER — OXYCODONE HCL 5 MG PO TABS
ORAL_TABLET | ORAL | Status: AC
  Filled 2015-11-07: qty 1

## 2015-11-07 MED ORDER — PROPOFOL 10 MG/ML IV BOLUS
INTRAVENOUS | Status: AC
  Filled 2015-11-07: qty 20

## 2015-11-07 MED ORDER — STERILE WATER FOR IRRIGATION IR SOLN
Status: DC | PRN
  Administered 2015-11-07: 500 mL

## 2015-11-07 MED ORDER — LIDOCAINE HCL (CARDIAC) 20 MG/ML IV SOLN
INTRAVENOUS | Status: DC | PRN
  Administered 2015-11-07: 60 mg via INTRAVENOUS

## 2015-11-07 MED ORDER — PHENAZOPYRIDINE HCL 200 MG PO TABS
200.0000 mg | ORAL_TABLET | Freq: Once | ORAL | Status: AC
  Administered 2015-11-07: 200 mg via ORAL
  Filled 2015-11-07: qty 1

## 2015-11-07 MED ORDER — HYDROCODONE-ACETAMINOPHEN 10-325 MG PO TABS
1.0000 | ORAL_TABLET | ORAL | Status: DC | PRN
Start: 2015-11-07 — End: 2024-07-10

## 2015-11-07 MED ORDER — FENTANYL CITRATE (PF) 100 MCG/2ML IJ SOLN
25.0000 ug | INTRAMUSCULAR | Status: DC | PRN
  Filled 2015-11-07: qty 1

## 2015-11-07 MED ORDER — CIPROFLOXACIN IN D5W 400 MG/200ML IV SOLN
400.0000 mg | INTRAVENOUS | Status: AC
  Administered 2015-11-07: 400 mg via INTRAVENOUS
  Filled 2015-11-07: qty 200

## 2015-11-07 MED ORDER — TAMSULOSIN HCL 0.4 MG PO CAPS
0.4000 mg | ORAL_CAPSULE | Freq: Once | ORAL | Status: AC
  Administered 2015-11-07: 0.4 mg via ORAL
  Filled 2015-11-07: qty 1

## 2015-11-07 MED ORDER — TAMSULOSIN HCL 0.4 MG PO CAPS
ORAL_CAPSULE | ORAL | Status: AC
  Filled 2015-11-07: qty 1

## 2015-11-07 MED ORDER — LACTATED RINGERS IV SOLN
INTRAVENOUS | Status: DC
  Administered 2015-11-07: 07:00:00 via INTRAVENOUS
  Filled 2015-11-07: qty 1000

## 2015-11-07 MED ORDER — OXYCODONE HCL 5 MG PO TABS
10.0000 mg | ORAL_TABLET | ORAL | Status: DC | PRN
  Administered 2015-11-07: 5 mg via ORAL
  Filled 2015-11-07: qty 2

## 2015-11-07 MED ORDER — PHENAZOPYRIDINE HCL 200 MG PO TABS: 200.0000 mg | ORAL_TABLET | Freq: Three times a day (TID) | ORAL | Status: DC | PRN

## 2015-11-07 MED ORDER — SODIUM CHLORIDE 0.9 % IR SOLN
Status: DC | PRN
  Administered 2015-11-07: 1000 mL
  Administered 2015-11-07 (×2): 3000 mL

## 2015-11-07 SURGICAL SUPPLY — 43 items
ADAPTER CATH URET PLST 4-6FR (CATHETERS) IMPLANT
ADPR CATH URET STRL DISP 4-6FR (CATHETERS)
BAG DRAIN URO-CYSTO SKYTR STRL (DRAIN) ×4 IMPLANT
BAG DRN UROCATH (DRAIN) ×2
BASKET DAKOTA 1.9FR 11X120 (BASKET) ×2 IMPLANT
BASKET LASER NITINOL 1.9FR (BASKET) IMPLANT
BASKET STNLS GEMINI 4WIRE 3FR (BASKET) IMPLANT
BASKET ZERO TIP NITINOL 2.4FR (BASKET) ×2 IMPLANT
BSKT STON RTRVL 120 1.9FR (BASKET)
BSKT STON RTRVL GEM 120X11 3FR (BASKET)
BSKT STON RTRVL ZERO TP 2.4FR (BASKET) ×2
CATH INTERMIT  6FR 70CM (CATHETERS) IMPLANT
CATH URET 5FR 28IN CONE TIP (BALLOONS)
CATH URET 5FR 70CM CONE TIP (BALLOONS) IMPLANT
CLOTH BEACON ORANGE TIMEOUT ST (SAFETY) ×4 IMPLANT
FIBER LASER FLEXIVA 365 (UROLOGICAL SUPPLIES) IMPLANT
FIBER LASER FLEXIVA 550 (UROLOGICAL SUPPLIES) IMPLANT
FIBER LASER TRAC TIP (UROLOGICAL SUPPLIES) ×2 IMPLANT
GLOVE BIO SURGEON STRL SZ8 (GLOVE) ×4 IMPLANT
GLOVE SURG SS PI 7.5 STRL IVOR (GLOVE) ×4 IMPLANT
GOWN STRL REUS W/ TWL LRG LVL3 (GOWN DISPOSABLE) ×2 IMPLANT
GOWN STRL REUS W/ TWL XL LVL3 (GOWN DISPOSABLE) ×2 IMPLANT
GOWN STRL REUS W/TWL LRG LVL3 (GOWN DISPOSABLE) ×4
GOWN STRL REUS W/TWL XL LVL3 (GOWN DISPOSABLE) ×6 IMPLANT
GUIDEWIRE 0.038 PTFE COATED (WIRE) IMPLANT
GUIDEWIRE ANG ZIPWIRE 038X150 (WIRE) IMPLANT
GUIDEWIRE STR DUAL SENSOR (WIRE) ×4 IMPLANT
IV NS 1000ML (IV SOLUTION) ×4
IV NS 1000ML BAXH (IV SOLUTION) IMPLANT
IV NS IRRIG 3000ML ARTHROMATIC (IV SOLUTION) ×8 IMPLANT
KIT BALLIN UROMAX 15FX10 (LABEL) IMPLANT
KIT BALLN UROMAX 15FX4 (MISCELLANEOUS) IMPLANT
KIT BALLN UROMAX 26 75X4 (MISCELLANEOUS)
KIT ROOM TURNOVER WOR (KITS) ×4 IMPLANT
MANIFOLD NEPTUNE II (INSTRUMENTS) ×2 IMPLANT
PACK CYSTO (CUSTOM PROCEDURE TRAY) ×4 IMPLANT
SET HIGH PRES BAL DIL (LABEL)
SHEATH ACCESS URETERAL 38CM (SHEATH) ×2 IMPLANT
STENT POLARIS LOOP 6FR X 24 CM (STENTS) ×2 IMPLANT
TUBE CONNECTING 12'X1/4 (SUCTIONS)
TUBE CONNECTING 12X1/4 (SUCTIONS) IMPLANT
WATER STERILE IRR 3000ML UROMA (IV SOLUTION) IMPLANT
WATER STERILE IRR 500ML POUR (IV SOLUTION) ×2 IMPLANT

## 2015-11-07 NOTE — Op Note (Signed)
PATIENT:  Arthur Reed  PRE-OPERATIVE DIAGNOSIS: Right renal calculi  POST-OPERATIVE DIAGNOSIS: Same  PROCEDURE:  1. Cystoscopy with removal of right ureteral stent 2. Right ureteroscopy and laser lithotripsy (second stage of a staged procedure.) 3. Right renal stone removal 4. Right ureteral stent placement  SURGEON: Garnett Farm, MD  INDICATION: Mr. Lusk is a 70 year old male who had a very large right renal calculus that was treated with PCNL. Due to its large size it was a staged procedure and he was found to have a 6 mm stone remaining within the kidney as well as other small stone fragments on CT scan. He is brought back for the second stage of his procedure in order to completely clear him of all right renal calculi.  ANESTHESIA:  General  EBL:  Minimal  DRAINS: 6 French, 24 cm Polaris stent (with string)  SPECIMEN:  Stone given to patient  DESCRIPTION OF PROCEDURE: The patient was taken to the major OR and placed on the table. General anesthesia was administered and then the patient was moved to the dorsal lithotomy position. The genitalia was sterilely prepped and draped. An official timeout was performed.  Initially the 23 French cystoscope with 30 lens was passed under direct vision into the bladder. I did note it was somewhat difficult to pass the scope through the area of the fossa navicularis but I was able to gently negotiate the scope through this area without any significant trauma. The bladder was then fully inspected. It was noted be free of any tumors, stones or inflammatory lesions. Ureteral orifices were of normal configuration and position. A stent was identified exiting the right ureteral orifice and was grasped with alligator forceps and withdrawn through the urethra. A 0.038 inch floppy-tipped guidewire was then passed through the stent and into the area the renal pelvis and the stent was removed.  A ureteral access sheath was then passed over the  guidewire to the level of the UPJ. The inner portion of the access sheath and guidewire were then removed and the access sheath was secured to the drape with a hemostat.  The 6 French flexible, digital ureteroscope was then passed through the access sheath and into the area of the renal pelvis. The calyces were then individually surveyed and I noted a stone in the upper pole calyx. I therefore used the [place] basket to grasp that stone and extract it. I then reinserted the ureteroscope and continued my survey area and I found another small stone fragment in the middle pole calyx and removed that as well. Finally the largest stone was identified in the middle pole calyx that had an oblique angle to the infundibulum making it extremely difficult to negotiate my scope into that calyx but I was able to gain access and I first tried the [place] basket and then tried a 0 tip nitinol basket but was unable to engage the stone due to the angle. I therefore felt fragmentation of the stone in situ would be the best way to remove the stone. The 200  holmium laser fiber was used to fragment the stone. I then used the [place] basket to extract individual pieces of the stone. This was much easier with the stone fragmented. I was able to remove all stone fragments larger than 1 mm. I then passed the guidewire through the ureteroscope and removed the ureteroscope and the access sheath.  I then passed the Polaris stent over the guidewire and then used the pusher to advance the stent  up the guidewire into the area the renal pelvis. As I began to remove the guidewire there was good curl noted in the renal pelvis. I then remove the guidewire completely.  He has some narrowing in the urethra in the area of the fossa navicularis and so I used a 17 French cystoscope with 30 lens to visualize the distal aspect of the stent which was noted to be exiting from the right ureteral orifice and in good position. I therefore drained the  bladder and removed the cystoscope. The tether was affixed to the dorsum of the penis and the patient was awakened and taken to the recovery room in stable and satisfactory condition. He tolerated the procedure well no intraoperative complications.   PLAN OF CARE: Discharge to home after PACU  PATIENT DISPOSITION:  PACU - hemodynamically stable.

## 2015-11-07 NOTE — Anesthesia Postprocedure Evaluation (Signed)
Anesthesia Post Note  Patient: Arthur Reed  Procedure(s) Performed: Procedure(s) (LRB): CYSTOSCOPY RIGHT URETEROSCOPY RIGHT STENT REMOVAL POSSIBLE STENT REPLACEMENT    (Right) HOLMIUM LASER APPLICATION (Right)  Patient location during evaluation: PACU Anesthesia Type: General Level of consciousness: awake and alert Pain management: pain level controlled Vital Signs Assessment: post-procedure vital signs reviewed and stable Respiratory status: spontaneous breathing, nonlabored ventilation, respiratory function stable and patient connected to nasal cannula oxygen Cardiovascular status: blood pressure returned to baseline and stable Postop Assessment: no signs of nausea or vomiting Anesthetic complications: no    Last Vitals:  Filed Vitals:   11/07/15 1000 11/07/15 1200  BP: 156/72 139/68  Pulse: 82 89  Temp:  36.8 C  Resp: 15 19    Last Pain:  Filed Vitals:   11/07/15 1217  PainSc: 3                  Cecile Hearing

## 2015-11-07 NOTE — Transfer of Care (Signed)
Immediate Anesthesia Transfer of Care Note  Patient: Arthur Reed  Procedure(s) Performed: Procedure(s): CYSTOSCOPY RIGHT URETEROSCOPY RIGHT STENT REMOVAL POSSIBLE STENT REPLACEMENT    (Right) HOLMIUM LASER APPLICATION (Right)  Patient Location: PACU  Anesthesia Type:General  Level of Consciousness: sedated and responds to stimulation  Airway & Oxygen Therapy: Patient Spontanous Breathing and Patient connected to nasal cannula oxygen  Post-op Assessment: Report given to RN  Post vital signs: Reviewed and stable  Last Vitals:  Filed Vitals:   11/07/15 0616 11/07/15 0900  BP: 143/73 158/77  Pulse: 89 88  Temp: 37 C 36.8 C  Resp: 20 10    Complications: No apparent anesthesia complications

## 2015-11-07 NOTE — Anesthesia Preprocedure Evaluation (Addendum)
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  Reproductive/Obstetrics                         Anesthesia Physical Anesthesia Plan  ASA: II  Anesthesia Plan: General   Post-op Pain Management:    Induction: Intravenous  Airway Management Planned: LMA  Additional Equipment:   Intra-op Plan:   Post-operative Plan: Extubation in OR  Informed Consent: I have reviewed the patients History and Physical, chart, labs and discussed the procedure including the risks, benefits and alternatives for the proposed anesthesia with the patient or authorized representative who has indicated his/her understanding and acceptance.   Dental advisory given  Plan Discussed with: CRNA  Anesthesia Plan Comments: (Risks/benefits of general anesthesia discussed with patient including risk of  damage to teeth, lips, gum, and tongue, nausea/vomiting, allergic reactions to medications, and the possibility of heart attack, stroke and death.  All patient questions answered.  Patient wishes to proceed.)        Anesthesia Quick Evaluation

## 2015-11-07 NOTE — Anesthesia Procedure Notes (Signed)
Procedure Name: LMA Insertion Date/Time: 11/07/2015 7:31 AM Performed by: Maris Berger T Pre-anesthesia Checklist: Patient identified, Emergency Drugs available, Suction available and Patient being monitored Patient Re-evaluated:Patient Re-evaluated prior to inductionOxygen Delivery Method: Circle System Utilized Preoxygenation: Pre-oxygenation with 100% oxygen Intubation Type: IV induction Ventilation: Mask ventilation without difficulty LMA: LMA with gastric port inserted LMA Size: 4.0 Number of attempts: 1 Placement Confirmation: positive ETCO2 Tube secured with: Tape Dental Injury: Teeth and Oropharynx as per pre-operative assessment

## 2015-11-07 NOTE — H&P (Signed)
Arthur Reed is a 70 year old male with residual calculi status post right PCNL.        Calculus disease: I saw him initially in 1/08 at which time he was passing right ureteral stone and a CT scan at that time revealed bilateral renal calculi with the largest measuring 10 x 14 mm located in the lower pole right kidney.   CT scan 03/03/15 revealed bilateral, nonobstructing renal calculi as well as a 1.4 cm stone seen within the right renal pelvis. According to the radiologist the stone burden had not changed since 9/14.  Right PCNL 09/18/15: I was able to clear out all but a 4 mm fragment from the large stone. The other stone in the lower pole calyx was in a parallel calyx and could not be accessed even with the flexible scope.    Interval history: His postoperative CT scan revealed that I had completely removed the large stone in his lower pole calyx with a single 4 mm fragment remaining from that stone and the other stone in the lower pole calyx could not be visualized at the time of his procedure as it was in a parallel calyx (7.5 x 8 mm). He was admitted to the hospital with right flank pain and was given a diagnosis of pyelonephritis although his urine did not appear infected, he had no fever and his urine culture was negative. What he did have was a small right perinephric hematoma that had developed. His hemoglobin remained stable.  He is not having any flank pain or hematuria. He didn't even know that he had a stent indwelling so he is clearly not being bothered by it.  Past Medical History Problems  1. History of diabetes mellitus (Z86.39)  Surgical History Problems  1. History of No Surgical Problems  Current Meds 1. Lantus 100 UNIT/ML Subcutaneous Solution;  Therapy: (Recorded:01Dec2016) to Recorded 2. NovoLOG FlexPen 100 UNIT/ML SOLN;  Therapy: (Recorded:01Dec2016) to Recorded  Allergies Medication  1. No Known Drug Allergies  Family History Problems  1. Family history  of Family Health Status Number Of Children   3 sons 2. Family history of Alzheimer's disease (Z82.0) : Mother 3. Family history of diabetes mellitus (Z83.3) : Father  Social History Problems    Alcohol use (Z78.9)   Caffeine use (F15.90)   Father deceased   Former smoker (Z87.891)   Married   Mother deceased   Number of children  Review of Systems Genitourinary, constitutional, skin, eye, otolaryngeal, hematologic/lymphatic, cardiovascular, pulmonary, endocrine, musculoskeletal, gastrointestinal, neurological and psychiatric system(s) were reviewed and pertinent findings if present are noted and are otherwise negative.  Genitourinary: urinary frequency, dysuria, nocturia, incontinence and urinary stream starts and stops.  Respiratory: shortness of breath.  Musculoskeletal: back pain.    Vitals Vital Signs  Height: 5 ft 8 in Weight: 138 lb  BMI Calculated: 20.98 BSA Calculated: 1.75 Heart Rate: 88  Physical Exam Constitutional: Well nourished and well developed . No acute distress.  ENT:. The ears and nose are normal in appearance.  Neck: The appearance of the neck is normal and no neck mass is present.  Pulmonary: No respiratory distress and normal respiratory rhythm and effort.  Cardiovascular: Heart rate and rhythm are normal . No peripheral edema.  Abdomen: The abdomen is soft and nontender. No masses are palpated. No CVA tenderness. No hernias are palpable. No hepatosplenomegaly noted.  Rectal: Rectal exam demonstrates normal sphincter tone, no tenderness and no masses. The prostate has no nodularity and is not tender. The  left seminal vesicle is nonpalpable. The right seminal vesicle is nonpalpable. The perineum is normal on inspection.  Genitourinary: Examination of the penis demonstrates no discharge, no masses, no lesions and a normal meatus. The scrotum is without lesions. The right epididymis is palpably normal and non-tender. The left epididymis is palpably  normal and non-tender. The right testis is non-tender and without masses. The left testis is non-tender and without masses.  Lymphatics: The femoral and inguinal nodes are not enlarged or tender.  Skin: Normal skin turgor, no visible rash and no visible skin lesions.  Neuro/Psych:. Mood and affect are appropriate.     Assessment   I discussed with him the options moving forward as he still has a 7 x 8 mm stone in the lower pole of his right kidney. It is nonobstructing and could be managed conservatively with metabolic stone evaluation and treatment based on that in order to prevent any further growth in size of the stone. If that occurred treatment could then be undertaken. The other option, because he currently has a stent indwelling, would be to proceed with ureteroscopic removal of his stone. He would like to proceed with ureteroscopic management of the remaining right renal calculi. I therefore went over the procedure with him in detail including its risks and complications, the alternatives, the probability of success, the outpatient nature and the anticipated postoperative course.    He had mentioned he was experiencing a decreased appetite. I discussed the use of Megace with him and will place him on a course of this short-term but have recommended he receive further medication for appetite stimulation from his primary care physician.    We also discussed the fact that his perinephric hematoma will be resorbed by his body and will not require any form of intervention or drainage.   Plan  He will be scheduled for right ureteroscopy and laser lithotripsy of his residual right renal stone.

## 2015-11-07 NOTE — Discharge Instructions (Signed)

## 2015-11-10 ENCOUNTER — Encounter (HOSPITAL_BASED_OUTPATIENT_CLINIC_OR_DEPARTMENT_OTHER): Payer: Self-pay | Admitting: Urology

## 2016-10-21 IMAGING — US IR URETURAL STENT RIGHT NEW ACCESS W/O SEP NEPHROSTOMY CATH
1 series · 1 of 1 positions shown · non-contrast
Comparison: CT of the abdomen and pelvis - 10/08/2006;

CLINICAL DATA: Renal stones, access for right percutaneous
nephrolithotomy.

EXAM:
IR URETERAL STENT RIGHT NEW ACCESS W/O SEP NEPHROSTOMY CATH
Date: 09/18/2015
PROCEDURE:
1. Percutaneous puncture of the renal collecting system under
fluoroscopic guidance
2. Placement of a percutaneous nephrostomy tube
TECHNIQUE: Informed written consent was obtained from the {patient} after a
discussion of the risks, benefits, and alternatives to treatment.
The flank region was prepped with Betadine in a sterile fashion, and
a sterile drape was applied covering the operative field. A sterile
gown and sterile gloves were used for the procedure. A timeout was
performed prior to the initiation of the procedure.

[Series 1: ir (id) (id)/(id)/(id) ir · 1 of 1 slices shown]
[im 1/1]
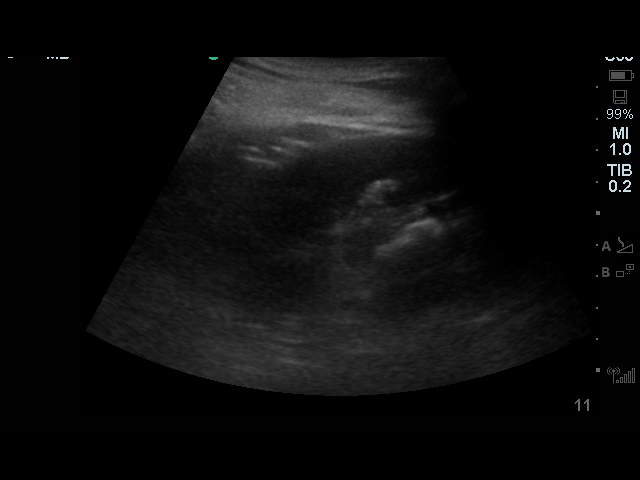

[1 of 1 positions shown; findings below may reference images not displayed]

abdominal
radiograph 08/28/2015

ANESTHESIA/SEDATION:
Moderate (conscious) sedation was used. 5 mg Versed, 150 mcg
Fentanyl were administered intravenously. The patient's vital signs
were monitored continuously by radiology nursing throughout the
procedure. 400 mg Cipro was administered IV at an appropriate time
prior to skin puncture.

Sedation Time: 97 minutes

FLUOROSCOPY TIME:  46 minutes 54 seconds for a total of 819 mGy

CONTRAST:  100 mL Omnipaque 300 instilled into the collecting
system.
A pre procedural spot fluoroscopic image was obtained of the upper
abdomen. Ultrasound scanning performed of the kidney was negative
for significant hydronephrosis. As such, the stone within the renal
pelvis was targeted fluoroscopically with a 22 gauge Chiba needle.
Access to the collecting system was confirmed with advancement of a
Nitrex wire into the collecting system. The needle was exchanged for
the inner 3 French catheter from an Accustick set and contrast
injection confirmed access. A small amount of air was injected into
the collecting system to help delineate a posterior calyx. A
posterior inferior calyx was targeted with a 22 gauge Chiba needle.
Access to the calyx was confirmed with advancement of a Nitrex wire
into the collecting system. Of note, the stone is severely impacted
in the renal pelvis. Extensive manipulation including use of an
angled 4 French glide catheter through the Accustick sheath, as well
as a selection of hydrophilic wires were required in order to
successfully navigate the wire past the obstructing stone. An
Accustick set was utilized to dilate the tract and was subsequently
exchanged for a Kumpe catheter over a Bentson wire. The Kumpe
catheter was advanced down the ureter and into the urinary bladder.
Postprocedural spot radiographs were obtained in various obliquities
and the catheter was sutured to the skin. The catheter was capped
and a dressing was placed. The patient tolerated the procedure well
without immediate postprocedural complication.

COMPLICATIONS:
None immediate
IMPRESSION: Successful fluoroscopic guided right percutaneous nephrostomy with
placement of a 4 French Kumpe catheter to the level of the urinary
bladder to be utilized during impending nephrolithotomy procedure.

## 2016-10-21 IMAGING — CT CT ABDOMEN W/O CM
2 of 4 series · 16 of 46 positions shown, 18 images · non-contrast
Comparison: CT 10/08/2006

CLINICAL DATA: Post percutaneous this nephrostomy access placement
4 nephro of lithotomy. Oral procedural blood loss.

EXAM:
CT ABDOMEN WITHOUT CONTRAST
TECHNIQUE: Multidetector CT imaging of the abdomen was performed following the
standard protocol without IV contrast.

[Series 3: rtn a/p w/o · axial · non-contrast · 0.80mm/px · z∈[-477,-257]mm · 13 of 50 slices shown, 15 images]
[im 3/50  soft-tissue]
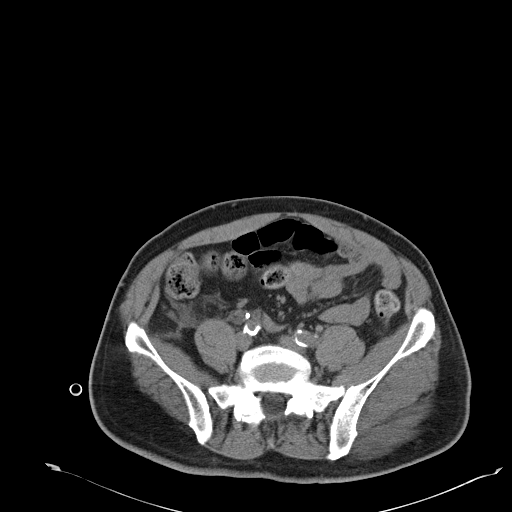
[im 3/50  bone]
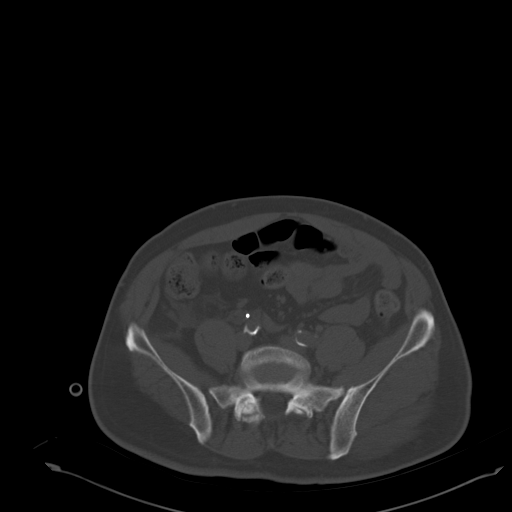
[im 7/50  soft-tissue]
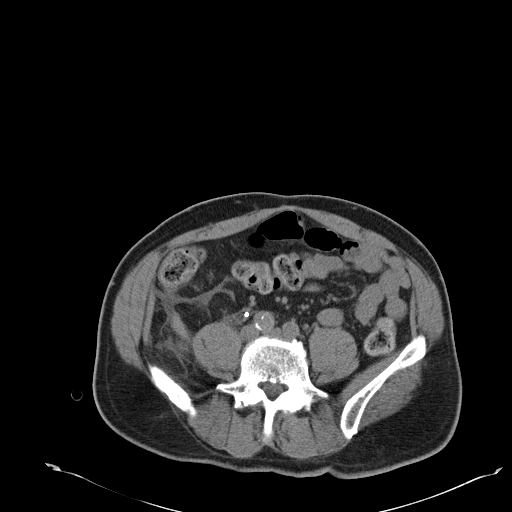
[im 12/50  soft-tissue]
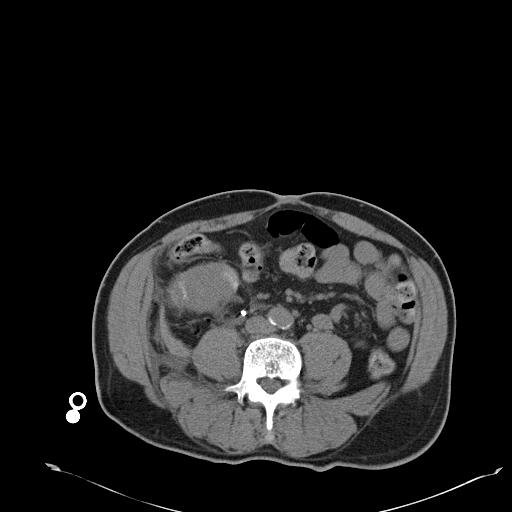
[im 14/50  soft-tissue]
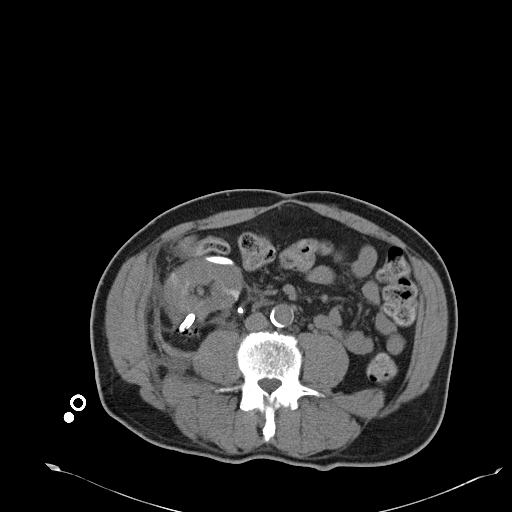
[im 18/50  soft-tissue]
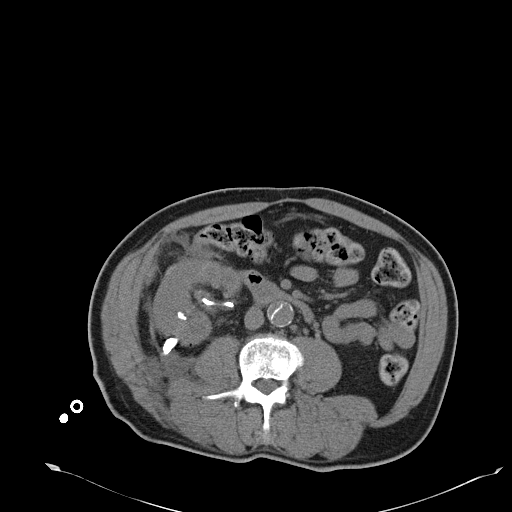
[im 21/50  soft-tissue]
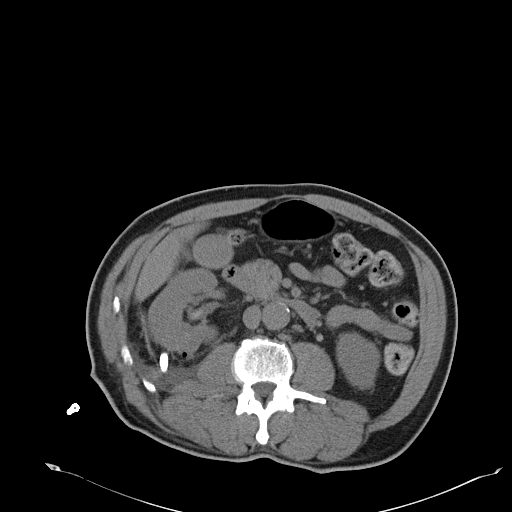
[im 25/50  soft-tissue]
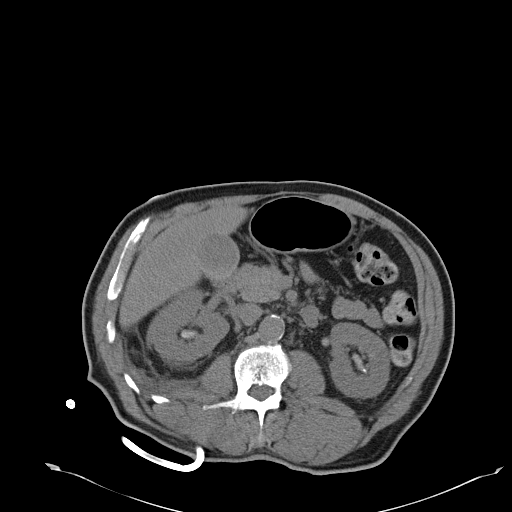
[im 29/50  soft-tissue]
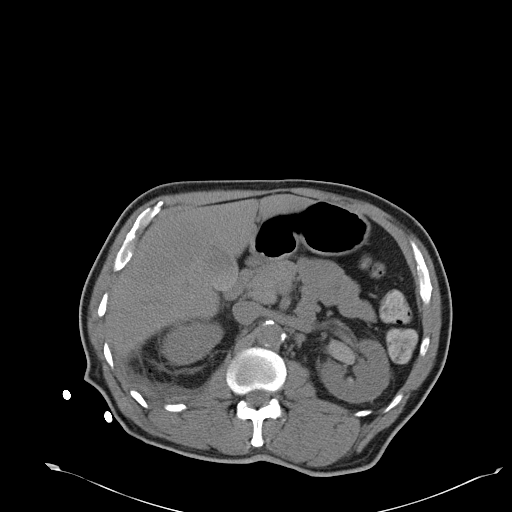
[im 32/50  soft-tissue]
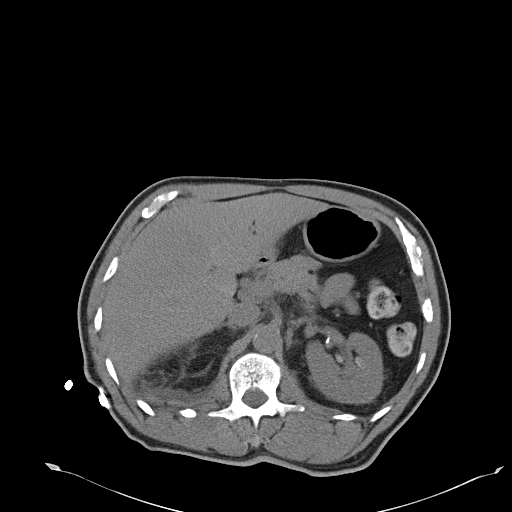
[im 32/50  bone]
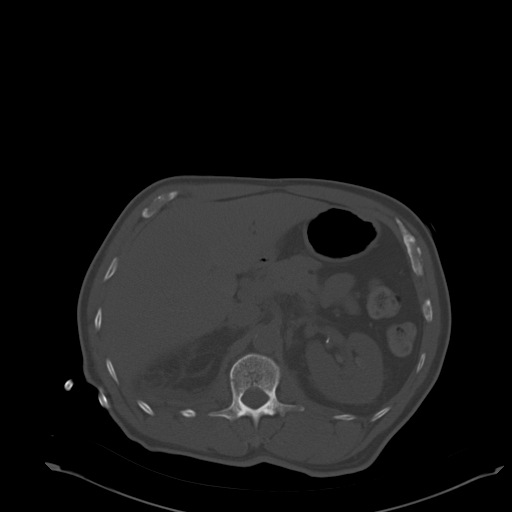
[im 36/50  soft-tissue]
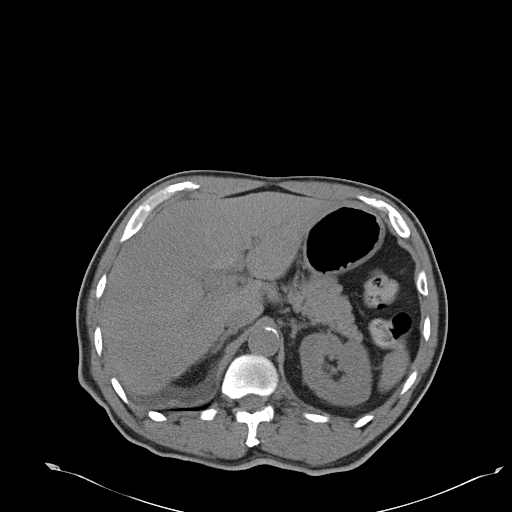
[im 38/50  soft-tissue]
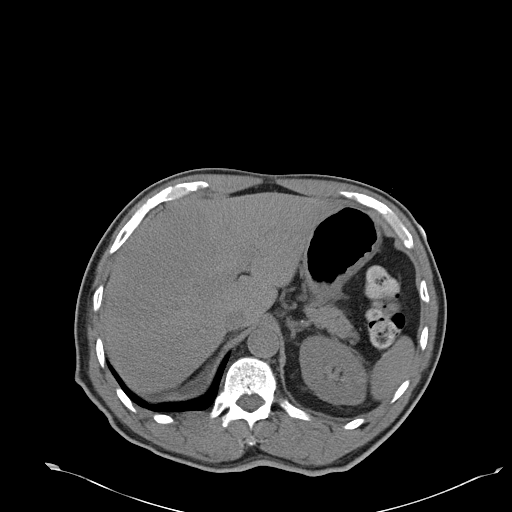
[im 43/50  soft-tissue]
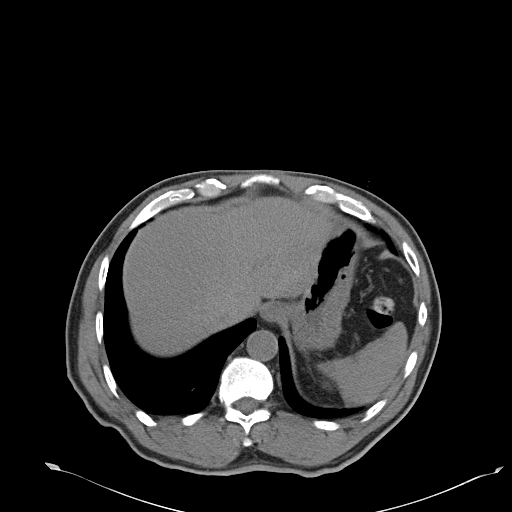
[im 47/50  soft-tissue]
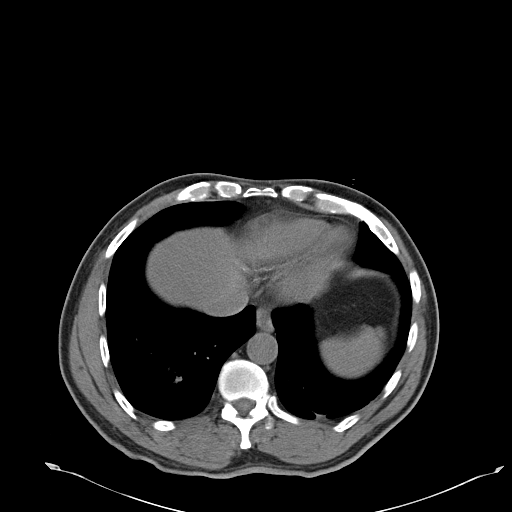

[Series 602: coronal · coronal · 0.80mm/px · 3 of 77 slices shown]
[im 26/77  soft-tissue]
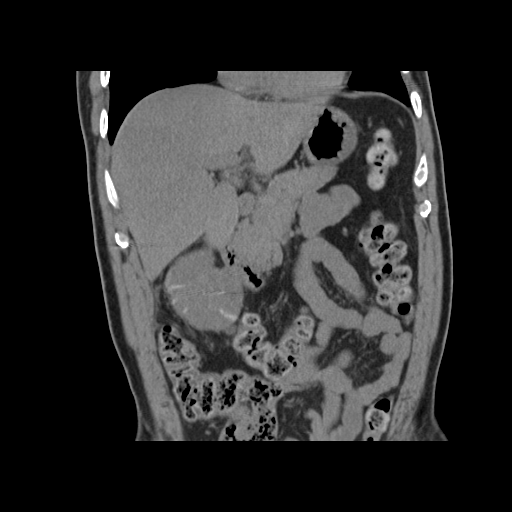
[im 34/77  soft-tissue]
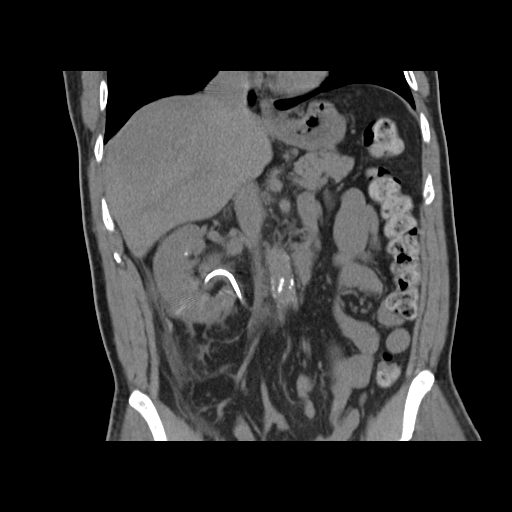
[im 43/77  soft-tissue]
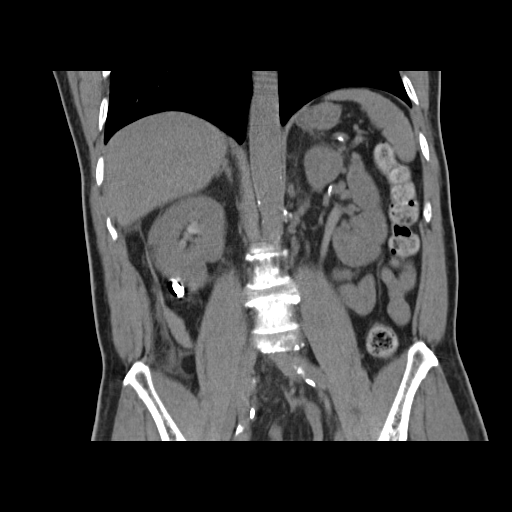

[16 of 46 positions shown; findings below may reference images not displayed]

FINDINGS: Lower chest:  Lung bases are clear.

Hepatobiliary: No focal hepatic lesions noncontrast exam. Normal
gallbladder.

Pancreas: Normal pancreatic parenchymal intensity. No ductal
dilatation or inflammation.

Spleen: Normal spleen.

Adrenals/urinary tract: Adrenal glands are normal. Percutaneous
nephrostomy tube on the RIGHT appears to terminate in the parenchyma
of the lower pole of the RIGHT kidney (image 37, series 3. A
double-J ureteral stent extends from the RIGHT renal hilum in the
pelvis. There is high-density subcapsular contrast in the lower pole
anteriorly and posteriorly (image 36, series 7 image 38, series 3)
of the RIGHT kidney. Coarse calcification lower pole of the RIGHT
kidney.

There is no perinephric hematoma identified. Small a gas in the
inferior pole the RIGHT kidney related to the nephrostomy tube
placement. There is no retroperitoneal hematoma identified on CT the
abdomen.

The several small calculi within the LEFT kidney without
obstruction.

Stomach/Bowel: Stomach and limited of the small bowel is
unremarkable

Vascular/Lymphatic: Abdominal aorta is normal caliber with
atherosclerotic calcification. There is no retroperitoneal or
periportal lymphadenopathy. No pelvic lymphadenopathy.

Musculoskeletal: No aggressive osseous lesion
IMPRESSION: 1. No perinephric or retroperitoneal hematoma identified.
2. High-density contrast in the subcapsular location within the
lower pole RIGHT kidney related to recent procedure.
3. Nephrostomy tube appears within the cortex of the RIGHT lower
pole.
4. Double-J ureteral stent on the RIGHT.
5. Bilateral renal calculi.

## 2020-02-18 ENCOUNTER — Telehealth: Payer: Self-pay | Admitting: *Deleted

## 2020-02-18 ENCOUNTER — Ambulatory Visit (INDEPENDENT_AMBULATORY_CARE_PROVIDER_SITE_OTHER): Payer: Self-pay | Admitting: Podiatry

## 2020-02-18 ENCOUNTER — Ambulatory Visit (INDEPENDENT_AMBULATORY_CARE_PROVIDER_SITE_OTHER): Payer: Self-pay

## 2020-02-18 ENCOUNTER — Other Ambulatory Visit: Payer: Self-pay

## 2020-02-18 DIAGNOSIS — R0989 Other specified symptoms and signs involving the circulatory and respiratory systems: Secondary | ICD-10-CM

## 2020-02-18 DIAGNOSIS — M79672 Pain in left foot: Secondary | ICD-10-CM

## 2020-02-18 DIAGNOSIS — M722 Plantar fascial fibromatosis: Secondary | ICD-10-CM

## 2020-02-18 DIAGNOSIS — M79671 Pain in right foot: Secondary | ICD-10-CM

## 2020-02-18 MED ORDER — MELOXICAM 7.5 MG PO TABS: 7.5000 mg | ORAL_TABLET | Freq: Every day | ORAL | 0 refills | Status: DC

## 2020-02-18 NOTE — Progress Notes (Signed)
Subjective:   Patient ID: Arthur Reed, male   DOB: 74 y.o.   MRN: [id number]   HPI 74 year old male presents the office over concerns of bilateral foot pain, swelling as well as burning to his heels.  He states the pain came on suddenly and his pain 8/10 at times but is intermittent.  He thinks it hurts more if he is on his feet.  He denies any recent injury.  He states both hurt about the same.  He did go to the [place] for this and was given sleeping pills he states but no other treatment.   Review of Systems  All other systems reviewed and are negative.  Past Medical History:  Diagnosis Date  . Dissection of abdominal aorta (HCC), short-segment, chronic, infrarenal 09/29/2015  . Dysuria   . Full dentures   . History of acute pyelonephritis    admission 09-28-2015  resolved  . History of kidney stones   . History of TB (tuberculosis)    2013 -- per pt treated for 6 months and didn't know if test positive or not-- THIS WAS DONE AT [place] in [place]  . Iliac artery stenosis, right (HCC)    severe stenosis right common iliac artery -- per CT 09-28-2015  . Renal calculi    bilateral non-obstructive per ct and on the right a large calculus  . Right flank pain   . Type 2 diabetes mellitus (HCC)   . Wears glasses     Past Surgical History:  Procedure Laterality Date  . CYSTOSCOPY WITH URETEROSCOPY AND STENT PLACEMENT Right 11/07/2015   Procedure: CYSTOSCOPY RIGHT URETEROSCOPY RIGHT STENT REMOVAL POSSIBLE STENT REPLACEMENT   ;  Surgeon: Ihor Gully, MD;  Location: [facility];  Service: Urology;  Laterality: Right;  . EYE SURGERY Bilateral 2014   laser for eye glaucoma  . HOLMIUM LASER APPLICATION Right 11/07/2015   Procedure: HOLMIUM LASER APPLICATION;  Surgeon: Ihor Gully, MD;  Location: [facility];  Service: Urology;  Laterality: Right;  . NEPHROLITHOTOMY Right 09/18/2015   Procedure: RIGHT PERCUTANEOUS NEPHROLITHOTOMY ;  Surgeon: Ihor Gully, MD;   Location: [facility];  Service: Urology;  Laterality: Right;     Current Outpatient Medications:  .  acetaminophen (TYLENOL) 325 MG tablet, Take 975 mg by mouth 2 (two) times daily as needed for mild pain or moderate pain., Disp: , Rfl:  .  HYDROcodone-acetaminophen (NORCO) 10-325 MG tablet, Take 1-2 tablets by mouth every 4 (four) hours as needed for moderate pain. Maximum dose per 24 hours - 8 pills, Disp: 30 tablet, Rfl: 0 .  insulin aspart (NOVOLOG) 100 UNIT/ML injection, Inject 8 Units into the skin 3 (three) times daily before meals., Disp: , Rfl:  .  insulin glargine (LANTUS) 100 UNIT/ML injection, Inject 15 Units into the skin at bedtime., Disp: , Rfl:  .  Oxycodone HCl 10 MG TABS, Take 1 tablet (10 mg total) by mouth every 4 (four) hours as needed., Disp: 30 tablet, Rfl: 0 .  phenazopyridine (PYRIDIUM) 200 MG tablet, Take 1 tablet (200 mg total) by mouth 3 (three) times daily as needed for pain., Disp: 30 tablet, Rfl: 0 .  phenazopyridine (PYRIDIUM) 200 MG tablet, Take 1 tablet (200 mg total) by mouth 3 (three) times daily as needed for pain., Disp: 30 tablet, Rfl: 0 .  tamsulosin (FLOMAX) 0.4 MG CAPS capsule, Take 1 capsule (0.4 mg total) by mouth daily. (Patient taking differently: Take 0.4 mg by mouth daily after supper. ), Disp: 30  capsule, Rfl: 2 .  meloxicam (MOBIC) 7.5 MG tablet, Take 1 tablet (7.5 mg total) by mouth daily., Disp: 14 tablet, Rfl: 0  No Known Allergies        Objective:  Physical Exam  General: AAO x3, NAD  Dermatological: Skin is warm, dry and supple bilateral. Nails x 10 are well manicured; remaining integument appears unremarkable at this time. There are no open sores, no preulcerative lesions, no rash or signs of infection present.  Vascular: DP pulses 1/4, PT pulses 2/4, CRT less than 3 seconds.  There is no pain with calf compression, swelling, warmth, erythema.   Neruologic: Grossly intact via light touch bilateral.   Musculoskeletal: There is  tenderness palpation on plantar medial tubercle of the calcaneus at the insertion of plantar fascial bilaterally.  There is no pain with lateral compression of calcaneus or to the Achilles tendon.  Flexor, extensor tendons appear to be intact.  No other areas of discomfort.  Muscular strength 5/5 in all groups tested bilateral.  Gait: Unassisted, Nonantalgic.       Assessment:   74 year old male with bilateral heel pain, likely plantar fasciitis; concern for decreased pedal pulses    Plan:  -Treatment options discussed including all alternatives, risks, and complications -Etiology of symptoms were discussed -X-rays were obtained and reviewed with the patient.  No evidence of acute fracture or stress fracture.  Mild vessel calcifications evident. -ABIs performed in the office which was abnormal.  We will order formal arterial studies. -Meloxicam 7.5 mg daily as needed.  Discussed side effects. -Discussed stretching, icing exercises to perform daily.  Discussed modifications and orthotics -Bilateral plantar fascial braces dispensed. -Held steroid injections today but discussed them.   Return in about 6 weeks (around 03/31/2020).  Trula Slade DPM

## 2020-02-18 NOTE — Telephone Encounter (Signed)
-----   Message from Vivi Barrack, DPM sent at 02/18/2020  9:49 AM EDT ----- Can you please order arterial studies? Thanks.

## 2020-02-18 NOTE — Patient Instructions (Signed)

## 2020-02-19 ENCOUNTER — Other Ambulatory Visit: Payer: Self-pay | Admitting: Podiatry

## 2020-02-19 DIAGNOSIS — M722 Plantar fascial fibromatosis: Secondary | ICD-10-CM

## 2020-02-21 NOTE — Telephone Encounter (Signed)
[facility] Referral Coordinator - S. Katrinka Blazing states pt is self pay.

## 2020-02-21 NOTE — Telephone Encounter (Signed)
Faxed orders to [facility]. 

## 2020-02-29 ENCOUNTER — Other Ambulatory Visit: Payer: Self-pay | Admitting: Podiatry

## 2020-02-29 DIAGNOSIS — I739 Peripheral vascular disease, unspecified: Secondary | ICD-10-CM

## 2020-02-29 DIAGNOSIS — R0989 Other specified symptoms and signs involving the circulatory and respiratory systems: Secondary | ICD-10-CM

## 2020-03-04 ENCOUNTER — Ambulatory Visit (HOSPITAL_COMMUNITY)
Admission: RE | Admit: 2020-03-04 | Payer: Self-pay | Source: Ambulatory Visit | Attending: Podiatry | Admitting: Podiatry

## 2020-04-07 ENCOUNTER — Ambulatory Visit: Payer: Self-pay | Admitting: Podiatry

## 2023-08-16 NOTE — ED Provider Notes (Signed)
 [facility]  ED Provider Note  Arthur Reed 77 y.o. male DOB: 07-Jul-1946 MRN: [id number] History   Chief Complaint  Patient presents with  . Chest Pain    BIB [facility] from the [place]. Pt reports chest pain x1 week, worse today. Pt A&Ox1 at baseline with dementia. 324 ASA and NTG x1 PTA.   Banks Chaikin presents to the ED with 1 week of intermittent chest pain.  His wife reports that when he has been performing tasks such as blowing leaves, he has gotten short of breath and developed chest pain within about 5 minutes of activity.  He has had to stop and rest.  He has not had any nausea or vomiting.  No fevers or cough.  No history of cardiac disease.  Sent from the [place] for further evaluation after EKG revealed new inferolateral T wave inversions.   History provided by:  Spouse History limited by:  Dementia Chest Pain     Past Medical History:  Diagnosis Date  . Dementia (*)     No past surgical history on file.  Social History   Substance and Sexual Activity  Alcohol Use Not on file   Social History   Tobacco Use  Smoking Status Not on file  Smokeless Tobacco Not on file   E-Cigarettes  . Vaping Use    . Start Date    . Cartridges/Day    . Quit Date     Social History   Substance and Sexual Activity  Drug Use Not on file         Not on File  Home Medications   ACETAMINOPHEN  (TYLENOL ) 500 MG TABLET    Take two tablets (1,000 mg dose) by mouth 2 (two) times a day as needed for Pain.   AMLODIPINE BESYLATE (NORVASC) 5 MG TABLET    Take one tablet (5 mg dose) by mouth daily.   ATORVASTATIN (LIPITOR) 40 MG TABLET    Take one tablet (40 mg dose) by mouth daily.   CHLORTHALIDONE 25 MG TABLET    Take one tablet (25 mg dose) by mouth daily.   GABAPENTIN (NEURONTIN) 400 MG CAPSULE    Take one capsule (400 mg dose) by mouth at bedtime.   LISINOPRIL (PRINIVIL,ZESTRIL) 40 MG TABLET    Take one tablet (40 mg dose) by mouth daily.    METFORMIN (GLUCOPHAGE) 500 MG TABLET    Take one tablet (500 mg dose) by mouth daily.   MIRTAZAPINE (REMERON) 15 MG TABLET    Take one tablet (15 mg dose) by mouth at bedtime.    Primary Survey  Primary Survey  Review of Systems   Review of Systems  Cardiovascular:  Positive for chest pain.    Physical Exam   ED Triage Vitals [08/16/23 1127]  BP (!) 180/86  Heart Rate 63  Resp 17  SpO2 97 %  Temp 98 F (36.7 C)    Physical Exam  Nursing note and vitals reviewed. Constitutional: He appears well-developed and well-nourished.  HENT:  Head: Normocephalic and atraumatic.  Mouth/Throat: Voice normal.  Eyes: Conjunctivae are normal. Right eye: no drainage. no conjunctival injection. Left eye: no drainage. no conjunctival injection.  Neck: Voice normal. Neck supple.  Cardiovascular: Normal rate, regular rhythm and normal heart sounds.  Pulmonary/Chest: Respiratory effort normal and breath sounds normal.  Musculoskeletal: No obvious deformity noted to extremities.     Cervical back: Neck supple. no edema.  Neurological: He is alert and oriented to person, place, and  time. Moves all extremities equally.  Skin: Skin is warm. Skin is dry.  Psychiatric: He has a normal mood and affect. His behavior is normal.    ED Course   Lab results:   GEN5 CARDIAC TROPONIN T (TNT5) BASELINE - Abnormal      Result Value   TnT-Gen5 (0hr) 66 (*)    Comment: An elevated Troponin indicates myocardial damage. Elevated troponin may also be due to pulmonary emboli, aortic dissection, heart failure, trauma, toxins and ischemia in the setting of critical illness.   CBC AND DIFFERENTIAL - Abnormal   WBC 6.6     RBC 4.45 (*)    HGB 12.7 (*)    HCT 40.3     MCV 90.6     MCH 28.5     MCHC 31.5 (*)    Plt Ct 312     RDW SD 46.2     MPV 9.2 (*)    NRBC% 0.0     Absolute NRBC Count 0.00     NEUTROPHIL % 67.3     LYMPHOCYTE % 25.4     MONOCYTE % 6.2     Eosinophil % 0.5     BASOPHIL % 0.3      IG% 0.3     ABSOLUTE NEUTROPHIL COUNT 4.46     ABSOLUTE LYMPHOCYTE COUNT 1.68     Absolute Monocyte Count 0.41     Absolute Eosinophil Count 0.03     Absolute Basophil Count 0.02     Absolute Immature Granulocyte Count 0.02    COMPREHENSIVE METABOLIC PANEL - Abnormal   Na 140     Potassium 4.6     Cl 102     CO2 30     AGAP 8     Glucose 100 (*)    BUN 13     Creatinine 0.98     Ca 10.0     ALK PHOS 75     T Bili 0.23     Total Protein 7.4     Alb 4.6     GLOBULIN 2.8     ALBUMIN/GLOBULIN RATIO 1.6     BUN/CREAT RATIO 13.3     ALT 14     AST 27     eGFR 80     Comment: Normal GFR (glomerular filtration rate) > 60 mL/min/1.73 meters squared, < 60 may include impaired kidney function. Calculation based on the Chronic Kidney Disease Epidemiology Collaboration (CK-EPI)equation refit without adjustment for race.  GEN5 CARDIAC TROPONIN T(TNT5) 1 HOUR - Abnormal   TnT-Gen5 (1hr) 67 (*)    Comment: An elevated Troponin indicates myocardial damage. Elevated troponin may also be due to pulmonary emboli, aortic dissection, heart failure, trauma, toxins and ischemia in the setting of critical illness.   Delta 1 Hour 1    GEN5 CARDIAC TROPONIN T(TNT5) 3 HOUR - Abnormal   TnT-Gen5 (3hr) 75 (*)    Comment: An elevated Troponin indicates myocardial damage. Elevated troponin may also be due to pulmonary emboli, aortic dissection, heart failure, trauma, toxins and ischemia in the setting of critical illness.   Delta 3 Hour 9 (*)   MAGNESIUM - Normal   Mg 2.0      Imaging:   XR CHEST AP PORTABLE   Narrative:    History:  Chest Pain  Comparison:  None  Technique: Portable chest  Interpretation: There is left lower lobe pneumonia or atelectasis. The lungs are otherwise grossly clear. Linear opacity projecting over the right upper chest likely  relates to the anterior second rib. The heart size is normal.    Impression:    Impression: Left lower lobe pneumonia or atelectasis  warranting attention on short interval radiographic follow-up.  Electronically Signed by: Lynwood Portugal on 08/16/2023 12:16 PM    ECG: ECG Results          ECG 12 lead (Final result)  Result time 08/16/23 16:03:19    Final result             Narrative:   Diagnosis Class Abnormal Acquisition Device D3K Systolic BP 180 Diastolic BP 81 Ventricular Rate 65 Atrial Rate 65 P-R Interval 136 QRS Duration 96 Q-T Interval 410 QTC Calculation(Bazett) 426 Calculated P Axis 67 Calculated R Axis 14 Calculated T Axis 26  Diagnosis Normal sinus rhythm Possible Anterior infarct (cited on or before 02-Dec-2022) Abnormal ECG When compared with ECG of 16-Aug-2023 12:32, No significant change was found Normal axis No acute injury pattern Brien Kung ([id number]) on 08/16/2023 4:03:08 PM certifies that he/she has reviewed the ECG tracing and confirms the independent  interpretation is correct.                         ECG 12 lead (Final result)  Result time 08/16/23 16:03:11    Final result             Narrative:   Diagnosis Class Abnormal Acquisition Device D3K Systolic BP 180 Diastolic BP 86 Ventricular Rate 66 Atrial Rate 66 P-R Interval 136 QRS Duration 86 Q-T Interval 390 QTC Calculation(Bazett) 408 Calculated P Axis 68 Calculated R Axis -2 Calculated T Axis -5  Diagnosis Normal sinus rhythm Possible Anterior infarct (cited on or before 02-Dec-2022) Abnormal ECG When compared with ECG of 16-Aug-2023 11:11, No significant change was found T wave inversions III, aVF, flattening V5, V6 Brien Kung ([id number]) on 08/16/2023 4:02:57 PM certifies that he/she has reviewed the ECG tracing and confirms the independent  interpretation is correct.                         ECG 12 lead (Final result)  Result time 08/16/23 16:03:43    Final result             Narrative:   Diagnosis Class Abnormal Acquisition Device D3K Systolic BP 180 Diastolic BP  86 Ventricular Rate 63 Atrial Rate 63 P-R Interval 130 QRS Duration 94 Q-T Interval 392 QTC Calculation(Bazett) 401 Calculated P Axis 66 Calculated R Axis 7 Calculated T Axis -11  Diagnosis Normal sinus rhythm Possible Anterior infarct (cited on or before 02-Dec-2022) T wave abnormality, consider inferior ischemia Abnormal ECG When compared with ECG of 02-Dec-2022 11:39, premature atrial complexes are no longer present T wave inversion now evident in Inferior leads T wave inversion now evident in Lateral leads Brien Kung ([id number]) on 08/16/2023 4:03:30 PM certifies that he/she has reviewed the ECG tracing and confirms the independent  interpretation is correct.                                                 Pre-Sedation Procedures    Medical Decision Making Davanta Meuser presents with intermittent chest pain that has been occurring for 1 week.  Chest pain is exertional.  He has some nonspecific changes in the inferolateral leads on his EKG.  Serial cardiac enzymes were ordered, and ultimately, HEAR greater than produced a disposition of no acute injury, clinical judgment.  I spoke with the patient and his wife.  I do think he likely needs stress testing given the exertional component to his presentation.  However I did talk with him about the option of hospitalization for observation.  The patient does suffer from severe dementia.  I think unnecessary hospitalization could worsen his underlying dementia.  I am confident we can schedule him for close follow-up, and this seems to be the best overall option for him.  Problems Addressed: Chest pain, unspecified type: acute illness or injury with systemic symptoms Primary hypertension: chronic illness or injury with exacerbation, progression, or side effects of treatment Severe dementia, unspecified dementia type, unspecified whether behavioral, psychotic, or mood disturbance or anxiety (*): chronic illness  or injury  Amount and/or Complexity of Data Reviewed Independent Historian: spouse External Data Reviewed: ECG and notes.    Details[facility] ordered. Decision-making details documented in ED Course. Radiology: ordered and independent interpretation performed. Decision-making details documented in ED Course.    Details: CXR shows no infiltrate ECG/medicine tests: ordered and independent interpretation performed. Decision-making details documented in ED Course.  Risk Decision regarding hospitalization.         Provider Communication  New Prescriptions   No medications on file    Modified Medications   No medications on file    Discontinued Medications   No medications on file    Clinical Impression Final diagnoses:  Chest pain, unspecified type  Primary hypertension  Severe dementia, unspecified dementia type, unspecified whether behavioral, psychotic, or mood disturbance or anxiety (*)    ED Disposition     ED Disposition  Discharge   Condition  Stable   Comment  --                   Electronically signed by:    Therisa KANDICE Silvan, MD 08/16/23 [phone]

## 2023-11-04 LAB — HEPATIC FUNCTION PANEL
ALT: 10 U/L (ref 10–40)
AST: 17 (ref 14–40)
Alkaline Phosphatase: 84 (ref 25–125)
Bilirubin, Direct: 0.1 (ref 0.01–0.4)
Bilirubin, Total: 0.3

## 2023-11-04 LAB — COMPREHENSIVE METABOLIC PANEL WITH GFR
Albumin: 4.2 (ref 3.5–5.0)
Calcium: 9.7 (ref 8.7–10.7)
eGFR: 51

## 2023-11-04 LAB — BASIC METABOLIC PANEL WITH GFR
BUN: 25 — AB (ref 4–21)
CO2: 30 — AB (ref 13–22)
Chloride: 107 (ref 99–108)
Creatinine: 1.4 — AB (ref 0.6–1.3)
Glucose: 112
Potassium: 4.5 meq/L (ref 3.5–5.1)
Sodium: 144 (ref 137–147)

## 2023-11-04 LAB — LIPID PANEL
Cholesterol: 166 (ref 0–200)
HDL: 53 (ref 35–70)
LDL Cholesterol: 92
Triglycerides: 165 — AB (ref 40–160)

## 2023-11-04 LAB — HEMOGLOBIN A1C
EGFR: 51
Hemoglobin A1C: 6.6

## 2024-07-10 ENCOUNTER — Ambulatory Visit (INDEPENDENT_AMBULATORY_CARE_PROVIDER_SITE_OTHER): Payer: Self-pay | Admitting: Internal Medicine

## 2024-07-10 ENCOUNTER — Encounter: Payer: Self-pay | Admitting: Internal Medicine

## 2024-07-10 VITALS — BP 128/78 | HR 61 | Temp 98.1°F | Ht 68.0 in | Wt 142.9 lb

## 2024-07-10 DIAGNOSIS — N1831 Chronic kidney disease, stage 3a: Secondary | ICD-10-CM

## 2024-07-10 DIAGNOSIS — R0609 Other forms of dyspnea: Secondary | ICD-10-CM

## 2024-07-10 DIAGNOSIS — E785 Hyperlipidemia, unspecified: Secondary | ICD-10-CM

## 2024-07-10 DIAGNOSIS — G301 Alzheimer's disease with late onset: Secondary | ICD-10-CM

## 2024-07-10 DIAGNOSIS — E1169 Type 2 diabetes mellitus with other specified complication: Secondary | ICD-10-CM

## 2024-07-10 DIAGNOSIS — F02C0 Dementia in other diseases classified elsewhere, severe, without behavioral disturbance, psychotic disturbance, mood disturbance, and anxiety: Secondary | ICD-10-CM

## 2024-07-10 DIAGNOSIS — Z1159 Encounter for screening for other viral diseases: Secondary | ICD-10-CM

## 2024-07-10 DIAGNOSIS — Z23 Encounter for immunization: Secondary | ICD-10-CM

## 2024-07-10 DIAGNOSIS — E1142 Type 2 diabetes mellitus with diabetic polyneuropathy: Secondary | ICD-10-CM

## 2024-07-10 DIAGNOSIS — I152 Hypertension secondary to endocrine disorders: Secondary | ICD-10-CM

## 2024-07-10 DIAGNOSIS — E1159 Type 2 diabetes mellitus with other circulatory complications: Secondary | ICD-10-CM

## 2024-07-10 LAB — LIPID PANEL
Cholesterol: 164 mg/dL (ref 0–200)
HDL: 35.6 mg/dL — ABNORMAL LOW (ref >39.00)
LDL Cholesterol: 106 mg/dL — ABNORMAL HIGH (ref 0–99)
NonHDL: 128.08
Total CHOL/HDL Ratio: 5
Triglycerides: 109 mg/dL (ref 0.0–149.0)
VLDL: 21.8 mg/dL (ref 0.0–40.0)

## 2024-07-10 LAB — POCT GLYCOSYLATED HEMOGLOBIN (HGB A1C)
HbA1c POC (<> result, manual entry): 6.3 % (ref 4.0–5.6)
HbA1c, POC (controlled diabetic range): 6.3 % (ref 0.0–7.0)
HbA1c, POC (prediabetic range): 6.3 % (ref 5.7–6.4)
Hemoglobin A1C: 6.3 % — AB (ref 4.0–5.6)

## 2024-07-10 LAB — COMPREHENSIVE METABOLIC PANEL WITH GFR
ALT: 9 U/L (ref 0–53)
AST: 16 U/L (ref 0–37)
Albumin: 4.1 g/dL (ref 3.5–5.2)
Alkaline Phosphatase: 64 U/L (ref 39–117)
BUN: 12 mg/dL (ref 6–23)
CO2: 32 meq/L (ref 19–32)
Calcium: 9.2 mg/dL (ref 8.4–10.5)
Chloride: 105 meq/L (ref 96–112)
Creatinine, Ser: 1.11 mg/dL (ref 0.40–1.50)
GFR: 64.1 mL/min (ref >60.00)
Glucose, Bld: 95 mg/dL (ref 70–99)
Potassium: 3.9 meq/L (ref 3.5–5.1)
Sodium: 141 meq/L (ref 135–145)
Total Bilirubin: 0.3 mg/dL (ref 0.2–1.2)
Total Protein: 7.3 g/dL (ref 6.0–8.3)

## 2024-07-10 LAB — CBC WITH DIFFERENTIAL/PLATELET
Basophils Absolute: 0 K/uL (ref 0.0–0.1)
Basophils Relative: 0.5 % (ref 0.0–3.0)
Eosinophils Absolute: 0.2 K/uL (ref 0.0–0.7)
Eosinophils Relative: 2.4 % (ref 0.0–5.0)
HCT: 34.1 % — ABNORMAL LOW (ref 39.0–52.0)
Hemoglobin: 11.2 g/dL — ABNORMAL LOW (ref 13.0–17.0)
Lymphocytes Relative: 37 % (ref 12.0–46.0)
Lymphs Abs: 2.5 K/uL (ref 0.7–4.0)
MCHC: 32.7 g/dL (ref 30.0–36.0)
MCV: 81.7 fl (ref 78.0–100.0)
Monocytes Absolute: 0.5 K/uL (ref 0.1–1.0)
Monocytes Relative: 7.1 % (ref 3.0–12.0)
Neutro Abs: 3.6 K/uL (ref 1.4–7.7)
Neutrophils Relative %: 53 % (ref 43.0–77.0)
Platelets: 363 K/uL (ref 150.0–400.0)
RBC: 4.17 Mil/uL — ABNORMAL LOW (ref 4.22–5.81)
RDW: 14.6 % (ref 11.5–15.5)
WBC: 6.8 K/uL (ref 4.0–10.5)

## 2024-07-10 NOTE — Assessment & Plan Note (Signed)
 Fair control on current.

## 2024-07-10 NOTE — Assessment & Plan Note (Signed)
 Stable but progressive per wife's report.  Followed by neurology at the [facility].

## 2024-07-10 NOTE — Assessment & Plan Note (Signed)
 Does not appear to be on statin therapy, most recent cholesterol panel from the [place] shows a total cholesterol of 166, LDL of 92, HDL 53 and triglycerides 165.  Check lipids today.

## 2024-07-10 NOTE — Assessment & Plan Note (Signed)
 Most recent creatinine from the [place] in February 2025 is 1.42 with a GFR of 51.  Recheck labs today.

## 2024-07-10 NOTE — Progress Notes (Signed)
 New Patient Office Visit     CC/Reason for Visit: Establish care, discuss chronic and acute concerns [facility] Visit: Unknown  HPI: Delwin Raczkowski is a 78 y.o. male who is coming in today for the above mentioned reasons. Past Medical History is significant for: Type 2 diabetes with polyneuropathy, hypertension, hyperlipidemia, stage IIIa chronic kidney disease, severe Alzheimer's dementia followed by neurology at the [place].  Here with his wife today who provides most of the history.  For the past year or so he has been dealing with dyspnea on exertion.  When he sweeps the floor or goes to the mailbox his wife will notice him gasping for air.  He had a stress test performed at the [facility] on November 09, 2023 that showed normal myocardial perfusion with no evidence of myocardial ischemia or infarct, left ventricular ejection fraction at 64% with normal left ventricular end-diastolic volumes.  It is unclear what other workup he has had for this.   Past Medical/Surgical History: Past Medical History:  Diagnosis Date   Dissection of abdominal aorta (HCC), short-segment, chronic, infrarenal 09/29/2015   Dysuria    Full dentures    History of acute pyelonephritis    admission 09-28-2015  resolved   History of kidney stones    History of TB (tuberculosis)    2013 -- per pt treated for 6 months and didn't know if test positive or not-- THIS WAS DONE AT [place] in [facility]   Iliac artery stenosis, right    severe stenosis right common iliac artery -- per CT 09-28-2015   Renal calculi    bilateral non-obstructive per ct and on the right a large calculus   Right flank pain    Type 2 diabetes mellitus (HCC)    Wears glasses     Past Surgical History:  Procedure Laterality Date   CYSTOSCOPY WITH URETEROSCOPY AND STENT PLACEMENT Right 11/07/2015   Procedure: CYSTOSCOPY RIGHT URETEROSCOPY RIGHT STENT REMOVAL POSSIBLE STENT REPLACEMENT   ;  Surgeon: Mark Ottelin, MD;  [facility]: [facility];  Service: Urology;  Laterality: Right;   EYE SURGERY Bilateral 2014   laser for eye glaucoma   HOLMIUM LASER APPLICATION Right 11/07/2015   Procedure: HOLMIUM LASER APPLICATION;  Surgeon: Oneil Rafter, MD;  Location: [facility];  Service: Urology;  Laterality: Right;   NEPHROLITHOTOMY Right 09/18/2015   Procedure: RIGHT PERCUTANEOUS NEPHROLITHOTOMY ;  Surgeon: Mark Ottelin, MD;  Location: [facility];  Service: Urology;  Laterality: Right;    Social History:  reports that he has never smoked. He has never used smokeless tobacco. He reports current alcohol use of about 14.0 - 21.0 standard drinks of alcohol per week. He reports that he does not use drugs.  Allergies: No Known Allergies  Family History:  History reviewed. No pertinent family history.   Current Outpatient Medications:    acetaminophen  (TYLENOL ) 325 MG tablet, Take 975 mg by mouth 2 (two) times daily as needed for mild pain or moderate pain., Disp: , Rfl:    amLODipine (NORVASC) 5 MG tablet, Take 5 mg by mouth., Disp: , Rfl:    chlorthalidone (HYGROTON) 25 MG tablet, Take 25 mg by mouth., Disp: , Rfl:    ergocalciferol (VITAMIN D2) 1.25 MG (50000 UT) capsule, Take 50,000 Units by mouth once a week., Disp: , Rfl:    gabapentin (NEURONTIN) 400 MG capsule, Take 400 mg by mouth., Disp: , Rfl:    lisinopril (ZESTRIL) 40 MG tablet, Take 40  mg by mouth., Disp: , Rfl:    melatonin 3 MG TABS tablet, Take 3 mg by mouth at bedtime., Disp: , Rfl:    memantine (NAMENDA) 10 MG tablet, Take 20 mg by mouth 2 (two) times daily., Disp: , Rfl:    metFORMIN (GLUCOPHAGE) 500 MG tablet, Take 500 mg by mouth., Disp: , Rfl:    mirtazapine (REMERON) 15 MG tablet, Take 15 mg by mouth., Disp: , Rfl:    rosuvastatin (CRESTOR) 10 MG tablet, Take 10 mg by mouth daily., Disp: , Rfl:   Review of Systems:  Negative except as indicated in HPI.   Physical Exam: Vitals:   07/10/24 1310 07/10/24 1337  BP: (!) 143/69 128/78   Pulse: 61   Temp: 98.1 F (36.7 C)   SpO2: 98%   Weight: 142 lb 14.4 oz (64.8 kg)   Height: 5' 8 (1.727 m)    Body mass index is 21.73 kg/m.  Physical Exam Vitals reviewed.  Constitutional:      Appearance: Normal appearance.  HENT:     Head: Normocephalic and atraumatic.  Eyes:     Conjunctiva/sclera: Conjunctivae normal.  Cardiovascular:     Rate and Rhythm: Normal rate and regular rhythm.  Pulmonary:     Effort: Pulmonary effort is normal.     Breath sounds: Normal breath sounds.  Skin:    General: Skin is warm and dry.  Neurological:     General: No focal deficit present.     Mental Status: He is alert.  Psychiatric:        Mood and Affect: Mood normal.        Behavior: Behavior normal.        Thought Content: Thought content normal.        Judgment: Judgment normal.       Impression and Plan:  Type 2 diabetes mellitus with diabetic polyneuropathy, without long-term current use of insulin  (HCC) Assessment & Plan: Well-controlled on current with an A1c of 6.3 today.  Orders: -     POCT glycosylated hemoglobin (Hb A1C) -     CBC with Differential/Platelet; Future -     Comprehensive metabolic panel with GFR; Future -     Microalbumin / creatinine urine ratio; Future -     Ambulatory referral to Ophthalmology  Hyperlipidemia associated with type 2 diabetes mellitus (HCC) Assessment & Plan: Does not appear to be on statin therapy, most recent cholesterol panel from the [place] shows a total cholesterol of 166, LDL of 92, HDL 53 and triglycerides 165.  Check lipids today.  Orders: -     Lipid panel; Future  Hypertension associated with diabetes [facility]) Assessment & Plan: Fair control on current.   CKD stage 3a, GFR 45-59 ml/min [facility]) Assessment & Plan: Most recent creatinine from the [place] in February 2025 is 1.42 with a GFR of 51.  Recheck labs today.   Severe late onset Alzheimer's dementia without behavioral disturbance, psychotic disturbance, mood  disturbance, or anxiety (HCC) Assessment & Plan: Stable but progressive per wife's report.  Followed by neurology at the [facility].   DOE (dyspnea on exertion) -     DG Chest 2 View; Future  Immunization due  Encounter for hepatitis C screening test for low risk patient -     Hepatitis C antibody; Future  -Flu vaccine given in office today.  Time spent: 47 minutes reviewing chart, interviewing and examining patient and formulating plan of care.      Tully [name], MD [place]  [facility]

## 2024-07-10 NOTE — Assessment & Plan Note (Signed)
 Well-controlled on current with an A1c of 6.3 today.

## 2024-07-11 LAB — MICROALBUMIN / CREATININE URINE RATIO
Creatinine,U: 0 mg/dL
Microalb Creat Ratio: UNDETERMINED mg/g (ref 0.0–30.0)
Microalb, Ur: <0.7 mg/dL

## 2024-07-11 LAB — HEPATITIS C ANTIBODY: Hepatitis C Ab: NONREACTIVE

## 2024-07-12 ENCOUNTER — Ambulatory Visit: Payer: Self-pay | Admitting: Internal Medicine

## 2024-07-12 DIAGNOSIS — E1169 Type 2 diabetes mellitus with other specified complication: Secondary | ICD-10-CM

## 2024-07-12 DIAGNOSIS — E1142 Type 2 diabetes mellitus with diabetic polyneuropathy: Secondary | ICD-10-CM

## 2024-07-12 MED ORDER — ROSUVASTATIN CALCIUM 20 MG PO CPSP: 20.0000 mg | ORAL_CAPSULE | Freq: Every day | ORAL | 2 refills | Status: DC

## 2024-07-17 ENCOUNTER — Telehealth: Payer: Self-pay | Admitting: *Deleted

## 2024-07-17 MED ORDER — ROSUVASTATIN CALCIUM 20 MG PO TABS: 20.0000 mg | ORAL_TABLET | Freq: Every day | ORAL | 1 refills | Status: DC

## 2024-07-17 NOTE — Telephone Encounter (Signed)
 New rx sent for tablet.

## 2024-07-25 NOTE — Addendum Note (Signed)
 Addended by: DIONISIO CAMELIA PARAS on: 07/25/2024 03:02 PM   Modules accepted: Orders

## 2024-08-21 ENCOUNTER — Emergency Department (HOSPITAL_COMMUNITY)

## 2024-08-21 ENCOUNTER — Encounter (HOSPITAL_COMMUNITY): Payer: Self-pay | Admitting: Internal Medicine

## 2024-08-21 ENCOUNTER — Other Ambulatory Visit: Payer: Self-pay

## 2024-08-21 DIAGNOSIS — Z781 Physical restraint status: Secondary | ICD-10-CM

## 2024-08-21 DIAGNOSIS — E871 Hypo-osmolality and hyponatremia: Secondary | ICD-10-CM | POA: Diagnosis present

## 2024-08-21 DIAGNOSIS — E1169 Type 2 diabetes mellitus with other specified complication: Secondary | ICD-10-CM | POA: Diagnosis present

## 2024-08-21 DIAGNOSIS — I152 Hypertension secondary to endocrine disorders: Secondary | ICD-10-CM | POA: Diagnosis present

## 2024-08-21 DIAGNOSIS — Z66 Do not resuscitate: Secondary | ICD-10-CM | POA: Diagnosis present

## 2024-08-21 DIAGNOSIS — I129 Hypertensive chronic kidney disease with stage 1 through stage 4 chronic kidney disease, or unspecified chronic kidney disease: Secondary | ICD-10-CM | POA: Diagnosis present

## 2024-08-21 DIAGNOSIS — I700 Atherosclerosis of aorta: Secondary | ICD-10-CM | POA: Diagnosis present

## 2024-08-21 DIAGNOSIS — I214 Non-ST elevation (NSTEMI) myocardial infarction: Principal | ICD-10-CM | POA: Diagnosis present

## 2024-08-21 DIAGNOSIS — E7849 Other hyperlipidemia: Secondary | ICD-10-CM | POA: Diagnosis present

## 2024-08-21 DIAGNOSIS — I959 Hypotension, unspecified: Secondary | ICD-10-CM | POA: Diagnosis present

## 2024-08-21 DIAGNOSIS — E1122 Type 2 diabetes mellitus with diabetic chronic kidney disease: Secondary | ICD-10-CM | POA: Diagnosis present

## 2024-08-21 DIAGNOSIS — N1831 Chronic kidney disease, stage 3a: Secondary | ICD-10-CM | POA: Diagnosis present

## 2024-08-21 DIAGNOSIS — F02818 Dementia in other diseases classified elsewhere, unspecified severity, with other behavioral disturbance: Secondary | ICD-10-CM | POA: Diagnosis present

## 2024-08-21 DIAGNOSIS — F02811 Dementia in other diseases classified elsewhere, unspecified severity, with agitation: Secondary | ICD-10-CM | POA: Diagnosis present

## 2024-08-21 DIAGNOSIS — Z79899 Other long term (current) drug therapy: Secondary | ICD-10-CM

## 2024-08-21 DIAGNOSIS — Z8611 Personal history of tuberculosis: Secondary | ICD-10-CM

## 2024-08-21 DIAGNOSIS — D72829 Elevated white blood cell count, unspecified: Secondary | ICD-10-CM | POA: Diagnosis present

## 2024-08-21 DIAGNOSIS — F02C11 Dementia in other diseases classified elsewhere, severe, with agitation: Principal | ICD-10-CM

## 2024-08-21 DIAGNOSIS — E872 Acidosis, unspecified: Secondary | ICD-10-CM | POA: Diagnosis present

## 2024-08-21 DIAGNOSIS — E1159 Type 2 diabetes mellitus with other circulatory complications: Secondary | ICD-10-CM | POA: Diagnosis present

## 2024-08-21 DIAGNOSIS — N179 Acute kidney failure, unspecified: Secondary | ICD-10-CM | POA: Diagnosis present

## 2024-08-21 DIAGNOSIS — Z7982 Long term (current) use of aspirin: Secondary | ICD-10-CM

## 2024-08-21 DIAGNOSIS — I252 Old myocardial infarction: Secondary | ICD-10-CM

## 2024-08-21 DIAGNOSIS — R000 Tachycardia, unspecified: Secondary | ICD-10-CM | POA: Diagnosis not present

## 2024-08-21 DIAGNOSIS — I442 Atrioventricular block, complete: Secondary | ICD-10-CM | POA: Diagnosis present

## 2024-08-21 DIAGNOSIS — E119 Type 2 diabetes mellitus without complications: Secondary | ICD-10-CM

## 2024-08-21 DIAGNOSIS — G301 Alzheimer's disease with late onset: Principal | ICD-10-CM | POA: Diagnosis present

## 2024-08-21 DIAGNOSIS — Z7984 Long term (current) use of oral hypoglycemic drugs: Secondary | ICD-10-CM

## 2024-08-21 LAB — CBC WITH DIFFERENTIAL/PLATELET
Abs Immature Granulocytes: 0.05 K/uL (ref 0.00–0.07)
Basophils Absolute: 0 K/uL (ref 0.0–0.1)
Basophils Relative: 0 %
Eosinophils Absolute: 0 K/uL (ref 0.0–0.5)
Eosinophils Relative: 0 %
HCT: 30.2 % — ABNORMAL LOW (ref 39.0–52.0)
Hemoglobin: 10 g/dL — ABNORMAL LOW (ref 13.0–17.0)
Immature Granulocytes: 0 %
Lymphocytes Relative: 11 %
Lymphs Abs: 1.7 K/uL (ref 0.7–4.0)
MCH: 27.9 pg (ref 26.0–34.0)
MCHC: 33.1 g/dL (ref 30.0–36.0)
MCV: 84.4 fL (ref 80.0–100.0)
Monocytes Absolute: 2 K/uL — ABNORMAL HIGH (ref 0.1–1.0)
Monocytes Relative: 13 %
Neutro Abs: 11.5 K/uL — ABNORMAL HIGH (ref 1.7–7.7)
Neutrophils Relative %: 76 %
Platelets: 306 K/uL (ref 150–400)
RBC: 3.58 MIL/uL — ABNORMAL LOW (ref 4.22–5.81)
RDW: 14.9 % (ref 11.5–15.5)
WBC: 15.2 K/uL — ABNORMAL HIGH (ref 4.0–10.5)
nRBC: 0 % (ref 0.0–0.2)

## 2024-08-21 LAB — LIPASE, BLOOD: Lipase: 31 U/L (ref 11–51)

## 2024-08-21 LAB — HEMOGLOBIN A1C
Hgb A1c MFr Bld: 5.6 % (ref 4.8–5.6)
Mean Plasma Glucose: 114.02 mg/dL

## 2024-08-21 LAB — COMPREHENSIVE METABOLIC PANEL WITH GFR
ALT: 52 U/L — ABNORMAL HIGH (ref 0–44)
AST: 271 U/L — ABNORMAL HIGH (ref 15–41)
Albumin: 3.6 g/dL (ref 3.5–5.0)
Alkaline Phosphatase: 76 U/L (ref 38–126)
Anion gap: 9 (ref 5–15)
BUN: 27 mg/dL — ABNORMAL HIGH (ref 8–23)
CO2: 27 mmol/L (ref 22–32)
Calcium: 9.5 mg/dL (ref 8.9–10.3)
Chloride: 95 mmol/L — ABNORMAL LOW (ref 98–111)
Creatinine, Ser: 1.42 mg/dL — ABNORMAL HIGH (ref 0.61–1.24)
GFR, Estimated: 51 mL/min — ABNORMAL LOW (ref >60)
Glucose, Bld: 142 mg/dL — ABNORMAL HIGH (ref 70–99)
Potassium: 4.5 mmol/L (ref 3.5–5.1)
Sodium: 132 mmol/L — ABNORMAL LOW (ref 135–145)
Total Bilirubin: 0.8 mg/dL (ref 0.0–1.2)
Total Protein: 7.1 g/dL (ref 6.5–8.1)

## 2024-08-21 LAB — TROPONIN T, HIGH SENSITIVITY
Troponin T High Sensitivity: 5149 ng/L (ref 0–19)
Troponin T High Sensitivity: 5299 ng/L (ref 0–19)

## 2024-08-21 LAB — GLUCOSE, CAPILLARY: Glucose-Capillary: 153 mg/dL — ABNORMAL HIGH (ref 70–99)

## 2024-08-21 MED ORDER — INSULIN ASPART 100 UNIT/ML IJ SOLN: 0.0000 [IU] | Freq: Every day | INTRAMUSCULAR | Status: DC

## 2024-08-21 MED ORDER — HEPARIN (PORCINE) 25000 UT/250ML-% IV SOLN
950.0000 [IU]/h | INTRAVENOUS | Status: DC
  Administered 2024-08-21: 750 [IU]/h via INTRAVENOUS
  Filled 2024-08-21: qty 250

## 2024-08-21 MED ORDER — GABAPENTIN 300 MG PO CAPS
600.0000 mg | ORAL_CAPSULE | Freq: Every day | ORAL | Status: DC
  Administered 2024-08-21: 600 mg via ORAL
  Filled 2024-08-21: qty 2

## 2024-08-21 MED ORDER — QUETIAPINE FUMARATE 25 MG PO TABS
50.0000 mg | ORAL_TABLET | Freq: Two times a day (BID) | ORAL | Status: DC
  Administered 2024-08-22: 50 mg via ORAL
  Filled 2024-08-21: qty 2

## 2024-08-21 MED ORDER — HEPARIN BOLUS VIA INFUSION
4000.0000 [IU] | Freq: Once | INTRAVENOUS | Status: AC
  Administered 2024-08-21: 4000 [IU] via INTRAVENOUS
  Filled 2024-08-21: qty 4000

## 2024-08-21 MED ORDER — GABAPENTIN 300 MG PO CAPS: 300.0000 mg | ORAL_CAPSULE | Freq: Two times a day (BID) | ORAL | Status: DC

## 2024-08-21 MED ORDER — ENSURE PLUS HIGH PROTEIN PO LIQD: 237.0000 mL | Freq: Two times a day (BID) | ORAL | Status: DC

## 2024-08-21 MED ORDER — ROSUVASTATIN CALCIUM 10 MG PO TABS
10.0000 mg | ORAL_TABLET | Freq: Every day | ORAL | Status: DC
  Administered 2024-08-21: 10 mg via ORAL
  Filled 2024-08-21: qty 1

## 2024-08-21 MED ORDER — QUETIAPINE FUMARATE 25 MG PO TABS
50.0000 mg | ORAL_TABLET | Freq: Once | ORAL | Status: AC
  Administered 2024-08-21: 50 mg via ORAL
  Filled 2024-08-21: qty 2

## 2024-08-21 MED ORDER — METOPROLOL TARTRATE 25 MG PO TABS
12.5000 mg | ORAL_TABLET | Freq: Two times a day (BID) | ORAL | Status: DC
  Administered 2024-08-21: 12.5 mg via ORAL
  Filled 2024-08-21: qty 1

## 2024-08-21 MED ORDER — QUETIAPINE FUMARATE 50 MG PO TABS
50.0000 mg | ORAL_TABLET | Freq: Once | ORAL | Status: AC
  Administered 2024-08-21: 50 mg via ORAL
  Filled 2024-08-21: qty 1

## 2024-08-21 MED ORDER — ONDANSETRON HCL 4 MG/2ML IJ SOLN: 4.0000 mg | Freq: Four times a day (QID) | INTRAMUSCULAR | Status: DC | PRN

## 2024-08-21 MED ORDER — RISPERIDONE 1 MG PO TABS
2.0000 mg | ORAL_TABLET | Freq: Two times a day (BID) | ORAL | Status: DC | PRN
  Administered 2024-08-22: 2 mg via ORAL
  Filled 2024-08-21 (×2): qty 2

## 2024-08-21 MED ORDER — ALBUTEROL SULFATE (2.5 MG/3ML) 0.083% IN NEBU: 2.5000 mg | INHALATION_SOLUTION | RESPIRATORY_TRACT | Status: DC | PRN

## 2024-08-21 MED ORDER — ACETAMINOPHEN 650 MG RE SUPP
650.0000 mg | Freq: Four times a day (QID) | RECTAL | Status: DC | PRN
Start: 2024-08-21 — End: 2024-08-22

## 2024-08-21 MED ORDER — AMLODIPINE BESYLATE 5 MG PO TABS: 5.0000 mg | ORAL_TABLET | Freq: Every day | ORAL | Status: DC

## 2024-08-21 MED ORDER — TRAZODONE HCL 50 MG PO TABS
25.0000 mg | ORAL_TABLET | Freq: Every evening | ORAL | Status: DC | PRN
  Administered 2024-08-21: 25 mg via ORAL
  Filled 2024-08-21: qty 1

## 2024-08-21 MED ORDER — ONDANSETRON HCL 4 MG PO TABS: 4.0000 mg | ORAL_TABLET | Freq: Four times a day (QID) | ORAL | Status: DC | PRN

## 2024-08-21 MED ORDER — GABAPENTIN 300 MG PO CAPS: 300.0000 mg | ORAL_CAPSULE | Freq: Every morning | ORAL | Status: DC

## 2024-08-21 MED ORDER — HALOPERIDOL LACTATE 5 MG/ML IJ SOLN
2.0000 mg | Freq: Four times a day (QID) | INTRAMUSCULAR | Status: DC | PRN
  Administered 2024-08-21 – 2024-08-22 (×3): 2 mg via INTRAVENOUS
  Filled 2024-08-21 (×3): qty 1

## 2024-08-21 MED ORDER — QUETIAPINE FUMARATE 25 MG PO TABS: 50.0000 mg | ORAL_TABLET | Freq: Two times a day (BID) | ORAL | Status: DC

## 2024-08-21 MED ORDER — ASPIRIN 81 MG PO CHEW: 81.0000 mg | CHEWABLE_TABLET | Freq: Every day | ORAL | Status: DC

## 2024-08-21 MED ORDER — INSULIN ASPART 100 UNIT/ML IJ SOLN: 0.0000 [IU] | Freq: Three times a day (TID) | INTRAMUSCULAR | Status: DC

## 2024-08-21 MED ORDER — ACETAMINOPHEN 325 MG PO TABS: 650.0000 mg | ORAL_TABLET | Freq: Four times a day (QID) | ORAL | Status: DC | PRN

## 2024-08-21 MED ORDER — ASPIRIN 325 MG PO TABS
325.0000 mg | ORAL_TABLET | Freq: Once | ORAL | Status: AC
  Administered 2024-08-21: 325 mg via ORAL
  Filled 2024-08-21: qty 1

## 2024-08-21 MED ORDER — MIRTAZAPINE 15 MG PO TABS
15.0000 mg | ORAL_TABLET | Freq: Every day | ORAL | Status: DC
  Administered 2024-08-21: 15 mg via ORAL
  Filled 2024-08-21: qty 1

## 2024-08-21 NOTE — ED Notes (Signed)
 Pt unable to follow commands to provide a urine sample. MD notified.

## 2024-08-21 NOTE — Progress Notes (Addendum)
 PHARMACY - ANTICOAGULATION CONSULT NOTE  Pharmacy Consult for heparin   Indication: chest pain/ACS  No Known Allergies  Patient Measurements:    Vital Signs: Temp: 99 F (37.2 C) (11/25 1218) Temp Source: Oral (11/25 1218) BP: 110/74 (11/25 1315) Pulse Rate: 85 (11/25 1315)  Labs: Recent Labs    08/21/24 1042 08/21/24 1319  HGB 10.0*  --   HCT 30.2*  --   PLT 306  --   CREATININE  --  1.42*    CrCl cannot be calculated (Unknown ideal weight.).   Medical History: Past Medical History:  Diagnosis Date   Dissection of abdominal aorta (HCC), short-segment, chronic, infrarenal 09/29/2015   Dysuria    Full dentures    History of acute pyelonephritis    admission 09-28-2015  resolved   History of kidney stones    History of TB (tuberculosis)    2013 -- per pt treated for 6 months and didn't know if test positive or not-- THIS WAS DONE AT [place] in [facility]   Iliac artery stenosis, right    severe stenosis right common iliac artery -- per CT 09-28-2015   Renal calculi    bilateral non-obstructive per ct and on the right a large calculus   Right flank pain    Type 2 diabetes mellitus (HCC)    Wears glasses      Assessment: Patient is a 78yo M admitted with chest pain for ~2weeks. Trop elevated (~5k) on admit.  Patient is not on chronic anticoagulation at home. Pharmacy consulted for heparin  management for ACS.   Today, 08/21/24 - Hgb 10, PLTc 306 -- at BL - Scr 1.42 (BL ~1), CrCl ~ 39.6 - Utilizing last office weight (07/10/24 - 64.8 kg) for today's dosing - No signs of bleeding documented    Goal of Therapy:  Heparin  level 0.3-0.7 units/ml Monitor platelets by anticoagulation protocol: Yes   Plan:  Heparin  bolus 4000 units IV x1 Start heparin  infusion at 750 units/hr Check heparin  level in 6 hrs after infusion start  Monitor CBC daily  Monitor signs and symptoms of bleeding   Tejasvi Brissett 08/21/2024,2:55 PM

## 2024-08-21 NOTE — ED Triage Notes (Signed)
 Pt arrives via EMS from home. Pt has hx of dementia. Wife reported that ems has had diarrhea for the past  2 days.

## 2024-08-21 NOTE — ED Notes (Signed)
 Pt keeps getting out of bed, hard to redirect. Bed alarm on. Request for safety sitter put in.

## 2024-08-21 NOTE — H&P (Signed)
 History and Physical  Arthur Reed FMW:[id number] DOB: 01-19-1946 DOA: 08/21/2024  PCP: [name], Tully GRADE, MD   Chief Complaint: Diarrhea, increased agitation  HPI: Gordy Goar is a 78 y.o. male with medical history significant for non-insulin -dependent type 2 diabetes, advanced Alzheimer's dementia who lives at home with his wife and is being admitted to the hospital with NSTEMI.  She states that he is somewhat disoriented and sundown's every night, however for the last 3 days he has been increasingly agitated and difficult to reorient, has not been sleeping well.  For the last couple weeks he has been intermittently complaining of chest pain, he was evaluated at the [facility] and nothing specific was found.  On evaluation here in the emergency department, he remains quite agitated, difficult to be oriented and was found to have significant troponin elevation without obvious acute EKG changes.  ER provider had extensive discussion with cardiology as well as with the patient's wife, who states that she would like the patient to be DNR/DNI and does not want any invasive procedures for him.  As such, plan is for medical therapy, he was started on IV heparin  drip and hospitalist admission was requested.  Review of Systems: Please see HPI for pertinent positives and negatives. A complete 10 system review of systems are otherwise negative.  Past Medical History:  Diagnosis Date   Dissection of abdominal aorta (HCC), short-segment, chronic, infrarenal 09/29/2015   Dysuria    Full dentures    History of acute pyelonephritis    admission 09-28-2015  resolved   History of kidney stones    History of TB (tuberculosis)    2013 -- per pt treated for 6 months and didn't know if test positive or not-- THIS WAS DONE AT [place] in [facility]   Iliac artery stenosis, right    severe stenosis right common iliac artery -- per CT 09-28-2015   Renal calculi    bilateral non-obstructive per ct and on the  right a large calculus   Right flank pain    Type 2 diabetes mellitus (HCC)    Wears glasses    Past Surgical History:  Procedure Laterality Date   CYSTOSCOPY WITH URETEROSCOPY AND STENT PLACEMENT Right 11/07/2015   Procedure: CYSTOSCOPY RIGHT URETEROSCOPY RIGHT STENT REMOVAL POSSIBLE STENT REPLACEMENT   ;  Surgeon: Mark Ottelin, MD;  Location: [facility];  Service: Urology;  Laterality: Right;   EYE SURGERY Bilateral 2014   laser for eye glaucoma   HOLMIUM LASER APPLICATION Right 11/07/2015   Procedure: HOLMIUM LASER APPLICATION;  Surgeon: Oneil Rafter, MD;  Location: [facility];  Service: Urology;  Laterality: Right;   NEPHROLITHOTOMY Right 09/18/2015   Procedure: RIGHT PERCUTANEOUS NEPHROLITHOTOMY ;  Surgeon: Mark Ottelin, MD;  Location: [facility];  Service: Urology;  Laterality: Right;   Social History:  reports that he has never smoked. He has never used smokeless tobacco. He reports current alcohol use of about 14.0 - 21.0 standard drinks of alcohol per week. He reports that he does not use drugs.  No Known Allergies  No family history on file.   Prior to Admission medications   Medication Sig Start Date End Date Taking? Authorizing Provider  acetaminophen  (TYLENOL ) 325 MG tablet Take 975 mg by mouth 2 (two) times daily as needed for mild pain or moderate pain.   Yes [provider]  amLODipine  (NORVASC ) 5 MG tablet Take 5 mg by mouth.   Yes [provider]  aspirin  81 MG chewable  tablet Chew 81 mg by mouth daily. 03/20/24  Yes [provider]  chlorthalidone (HYGROTON) 25 MG tablet Take 25 mg by mouth.   Yes [provider]  ergocalciferol (VITAMIN D2) 1.25 MG (50000 UT) capsule Take 50,000 Units by mouth once a week.   Yes [provider]  gabapentin  (NEURONTIN ) 300 MG capsule Take 300-600 mg by mouth 2 (two) times daily. Take one capsule in the morning. Take two capsules in the evening.   Yes [provider]  lisinopril (ZESTRIL) 40 MG tablet Take 40 mg by mouth.   Yes [provider]  melatonin 3 MG TABS tablet Take 3 mg by mouth at bedtime.   Yes [provider]  memantine (NAMENDA) 10 MG tablet Take 10 mg by mouth 2 (two) times daily.   Yes [provider]  metFORMIN (GLUCOPHAGE) 500 MG tablet Take 500 mg by mouth daily with breakfast.   Yes [provider]  mirtazapine  (REMERON ) 15 MG tablet Take 15 mg by mouth.   Yes [provider]  risperiDONE  (RISPERDAL ) 2 MG tablet Take 2 mg by mouth 2 (two) times daily as needed (agitation and anxiety). 05/04/24  Yes [provider]  rosuvastatin  (CRESTOR ) 10 MG tablet Take 10 mg by mouth daily.   Yes [provider]  rosuvastatin  (CRESTOR ) 20 MG tablet Take 1 tablet (20 mg total) by mouth daily. Patient not taking: Reported on 08/21/2024 07/17/24   [name], Tully GRADE, MD    Physical Exam: BP 110/74   Pulse 85   Temp 99 F (37.2 C) (Oral)   Resp (!) 24   SpO2 100%  General:  Alert, disoriented, somewhat agitated, trying to get out of the stretcher, he has taken himself out of his waistband as well as his bilateral mitts.  His wife is at the bedside.  She is frustrated. Cardiovascular: RRR, no murmurs or rubs, no peripheral edema  Respiratory: clear to auscultation bilaterally, no wheezes, no crackles  Abdomen: soft, nontender, nondistended, normal bowel tones heard  Skin: dry, no rashes  Musculoskeletal: no joint effusions, normal range of motion  Psychiatric: appropriate affect, clear speech  Neurologic: extraocular muscles intact, clear speech, moving all extremities with intact sensorium         Labs on Admission:  Basic Metabolic Panel: Recent Labs  Lab 08/21/24 1319  NA 132*  K 4.5  CL 95*  CO2 27  GLUCOSE 142*  BUN 27*  CREATININE 1.42*  CALCIUM  9.5   Liver Function Tests: Recent Labs  Lab 08/21/24 1319  AST 271*  ALT 52*  ALKPHOS 76   BILITOT 0.8  PROT 7.1  ALBUMIN 3.6   Recent Labs  Lab 08/21/24 1319  LIPASE 31   No results for input(s): AMMONIA in the last 168 hours. CBC: Recent Labs  Lab 08/21/24 1042  WBC 15.2*  NEUTROABS 11.5*  HGB 10.0*  HCT 30.2*  MCV 84.4  PLT 306   Cardiac Enzymes: No results for input(s): CKTOTAL, CKMB, CKMBINDEX, TROPONINI in the last 168 hours. BNP (last 3 results) No results for input(s): BNP in the last 8760 hours.  ProBNP (last 3 results) No results for input(s): PROBNP in the last 8760 hours.  CBG: No results for input(s): GLUCAP in the last 168 hours.  Radiological Exams on Admission: DG Chest Port 1 View Result Date: 08/21/2024 CLINICAL DATA:  alt ms EXAM: PORTABLE CHEST - 1 VIEW COMPARISON:  March 28, 2012 FINDINGS: Low lung volumes. Reticular opacity in the  right suprahilar region, likely scarring. Streaky opacities in the left lung base, likely atelectasis no focal airspace consolidation, pleural effusion, or pneumothorax. The cardiac silhouette is at the upper limits of normal, likely accentuated by AP technique and low lung volumes. Aortic atherosclerosis. No acute fracture or destructive lesions. Multilevel thoracic osteophytosis. IMPRESSION: Low lung volumes.  Otherwise, no acute cardiopulmonary abnormality. Electronically Signed   By: Rogelia Myers M.D.   On: 08/21/2024 12:13   Assessment/Plan Jameire Kouba is a 78 y.o. male with medical history significant for non-insulin -dependent type 2 diabetes, advanced Alzheimer's dementia who lives at home with his wife and is being admitted to the hospital with NSTEMI.  NSTEMI-with some inferior ST changes but these may be chronic, troponin greater than 5000. -Inpatient admission -Monitor closely on progressive unit, with continuous telemetry -IV heparin  drip -ASA 325 x 1 now -Start metoprolol  to tartrate 12.5 p.o. twice daily -2D echo -Inpatient cardiology consult  Type 2 diabetes-carb  modified diet when eating, with moderate dose sliding scale, update A1c  Alzheimer's dementia with behavioral disturbance-Remeron , Risperdal  twice daily as needed agitation, IV Haldol  as needed for agitation, will place patient in wrist restraints  Hyperlipidemia-Crestor , check lipid panel  Hypertension-amlodipine  5 mg p.o. daily, first dose tomorrow as he is mildly hypotensive today     Code Status: Limited: Do not attempt resuscitation (DNR) -DNR-LIMITED -Do Not Intubate/DNI   Consults called: Cardiology  Admission status: The appropriate patient status for this patient is INPATIENT. Inpatient status is judged to be reasonable and necessary in order to provide the required intensity of service to ensure the patient's safety. The patient's presenting symptoms, physical exam findings, and initial radiographic and laboratory data in the context of their chronic comorbidities is felt to place them at high risk for further clinical deterioration. Furthermore, it is not anticipated that the patient will be medically stable for discharge from the hospital within 2 midnights of admission.    I certify that at the point of admission it is my clinical judgment that the patient will require inpatient hospital care spanning beyond 2 midnights from the point of admission due to high intensity of service, high risk for further deterioration and high frequency of surveillance required  Time spent: 59 minutes  Kailee Essman CHRISTELLA Gail MD [facility] Pager [phone]  If 7PM-7AM, please contact night-coverage [email or web address] Password [facility]  08/21/2024, 3:46 PM

## 2024-08-21 NOTE — ED Notes (Signed)
 In and out unsuccessful, no urine obtained

## 2024-08-21 NOTE — ED Provider Notes (Signed)
 [place] EMERGENCY DEPARTMENT AT [facility] Provider Note   CSN: [id number] Arrival date & time: 08/21/24  9094     Patient presents with: Diarrhea   Arthur Reed is a 78 y.o. male.   HPI Patient has Alzheimer's dementia.  Patient's wife reports that he had dementia symptoms for about 2 years or more but it took a while to get the diagnosis of Alzheimer's.  She reports at baseline, patient needs a lot of direction and assistance but typically he is directable and can manage some of his own ADLs with direction and assistance.  He has issues with yelling and sometimes insistence on certain activities such as that he wants her to drive him all around and has no destination.  He states he wants to go home but is clearly at his home.  However, he was maintaining his room, self dressing with guidance, feeding himself.  She reports that her basic routine would be getting up early at about 530, eating dinner and taking medications early in the evening and then getting to bed around 9 or 10.  He would sometimes get up in the middle of the night but typically has been staying in bed most of the night.  She reports things really deteriorated over the past week or so.  Patient has been much more agitated and not directable.  She reports he is making a mess of all of his things at home and cannot seem to understand how to organize them.  He has been doing unusual picking behavior, been more explosive, last night he got up in the middle the night and had diarrheal stool all over the bathroom.  She reports he has barely eaten in 2 days.  She made soup and he refused it but then he drank the broth.  Later he came back and ate all of the noodles at once and vomited.  These things have been out of his typical behavior and eating patterns.  Notably, she reports that he has been complaining of chest pain for about 2 weeks.  They were seen at the [facility] and evaluated.  She reports they were told there was  nothing specific found.  She showed me a report with a lipid panel on it but at this time I do not see other chest pain workup.    Prior to Admission medications   Medication Sig Start Date End Date Taking? Authorizing Provider  acetaminophen  (TYLENOL ) 325 MG tablet Take 975 mg by mouth 2 (two) times daily as needed for mild pain or moderate pain.    [provider]  amLODipine  (NORVASC ) 5 MG tablet Take 5 mg by mouth.    [provider]  chlorthalidone (HYGROTON) 25 MG tablet Take 25 mg by mouth.    [provider]  ergocalciferol (VITAMIN D2) 1.25 MG (50000 UT) capsule Take 50,000 Units by mouth once a week.    [provider]  gabapentin  (NEURONTIN ) 400 MG capsule Take 400 mg by mouth.    [provider]  lisinopril (ZESTRIL) 40 MG tablet Take 40 mg by mouth.    [provider]  melatonin 3 MG TABS tablet Take 3 mg by mouth at bedtime.    [provider]  memantine (NAMENDA) 10 MG tablet Take 20 mg by mouth 2 (two) times daily.    [provider]  metFORMIN (GLUCOPHAGE) 500 MG tablet Take 500 mg by mouth.    [provider]  mirtazapine  (REMERON ) 15 MG tablet  Take 15 mg by mouth.    [provider]  rosuvastatin  (CRESTOR ) 20 MG tablet Take 1 tablet (20 mg total) by mouth daily. 07/17/24   [name], Tully GRADE, MD    Allergies: Patient has no known allergies.    Review of Systems  Updated Vital Signs BP 110/74   Pulse 85   Temp 99 F (37.2 C) (Oral)   Resp (!) 24   SpO2 100%   Physical Exam Constitutional:      Comments: Patient is alert.  He is not exhibiting any respiratory distress.  He is well-nourished well-developed.  He is slightly agitated with picking and motions.  He does interact verbally but not really situationally appropriate or organized conversation.  HENT:     Mouth/Throat:     Pharynx: Oropharynx is clear.  Eyes:     Extraocular Movements: Extraocular movements  intact.  Cardiovascular:     Rate and Rhythm: Normal rate and regular rhythm.     Heart sounds: Normal heart sounds.  Pulmonary:     Effort: Pulmonary effort is normal.     Breath sounds: Normal breath sounds.  Abdominal:     General: There is no distension.     Palpations: Abdomen is soft.     Tenderness: There is no abdominal tenderness. There is no guarding.  Musculoskeletal:        General: No swelling or tenderness. Normal range of motion.     Right lower leg: No edema.     Left lower leg: No edema.  Skin:    General: Skin is warm and dry.  Neurological:     Comments: Patient is very alert.  He shows no signs of somnolence or obtundation.  He does seem situationally confused.  He will gaze and hold eye contact and respond with verbal responses but they are not really situationally appropriate.  Speech is slightly mumbling and somewhat difficult to understand.  Patient has symmetric use of all extremities.  He can purposefully remove garments and restraints.  He does not have any focal deficits and is moving about in the stretcher in a physically coordinated fashion.     (all labs ordered are listed, but only abnormal results are displayed) Labs Reviewed  CBC WITH DIFFERENTIAL/PLATELET - Abnormal; Notable for the following components:      Result Value   WBC 15.2 (*)    RBC 3.58 (*)    Hemoglobin 10.0 (*)    HCT 30.2 (*)    Neutro Abs 11.5 (*)    Monocytes Absolute 2.0 (*)    All other components within normal limits  COMPREHENSIVE METABOLIC PANEL WITH GFR - Abnormal; Notable for the following components:   Sodium 132 (*)    Chloride 95 (*)    Glucose, Bld 142 (*)    BUN 27 (*)    Creatinine, Ser 1.42 (*)    AST 271 (*)    ALT 52 (*)    GFR, Estimated 51 (*)    All other components within normal limits  TROPONIN T, HIGH SENSITIVITY - Abnormal; Notable for the following components:   Troponin T High Sensitivity 5,149 (*)    All other components within normal limits   TROPONIN T, HIGH SENSITIVITY - Abnormal; Notable for the following components:   Troponin T High Sensitivity 5,299 (*)    All other components within normal limits  LIPASE, BLOOD  URINALYSIS, ROUTINE W REFLEX MICROSCOPIC    EKG: EKG Interpretation Date/Time:  Tuesday August 21 2024  10:45:00 EST Ventricular Rate:  89 PR Interval:  168 QRS Duration:  107 QT Interval:  360 QTC Calculation: 438 R Axis:   -50  Text Interpretation: Sinus rhythm Incomplete left bundle branch block Consider anterolateral infarct agree. ischemic appearance but similar changes on 2017 tracing Confirmed by Armenta Canning [phone]) on 08/21/2024 11:17:22 AM  Radiology: [facility] Chest Port 1 View Result Date: 08/21/2024 CLINICAL DATA:  alt ms EXAM: PORTABLE CHEST - 1 VIEW COMPARISON:  March 28, 2012 FINDINGS: Low lung volumes. Reticular opacity in the right suprahilar region, likely scarring. Streaky opacities in the left lung base, likely atelectasis no focal airspace consolidation, pleural effusion, or pneumothorax. The cardiac silhouette is at the upper limits of normal, likely accentuated by AP technique and low lung volumes. Aortic atherosclerosis. No acute fracture or destructive lesions. Multilevel thoracic osteophytosis. IMPRESSION: Low lung volumes.  Otherwise, no acute cardiopulmonary abnormality. Electronically Signed   By: Rogelia Myers M.D.   On: 08/21/2024 12:13     Procedures  CRITICAL CARE Performed by: Canning Armenta   Total critical care time: 60 minutes  Critical care time was exclusive of separately billable procedures and treating other patients.  Critical care was necessary to treat or prevent imminent or life-threatening deterioration.  Critical care was time spent personally by me on the following activities: development of treatment plan with patient and/or surrogate as well as nursing, discussions with consultants, evaluation of patient's response to treatment, examination of patient,  obtaining history from patient or surrogate, ordering and performing treatments and interventions, ordering and review of laboratory studies, ordering and review of radiographic studies, pulse oximetry and re-evaluation of patient's condition.  Medications Ordered in the ED  heparin  bolus via infusion 4,000 Units (has no administration in time range)  heparin  ADULT infusion 100 units/mL (25000 units/250mL) (has no administration in time range)  acetaminophen  (TYLENOL ) tablet 650 mg (has no administration in time range)    Or  acetaminophen  (TYLENOL ) suppository 650 mg (has no administration in time range)  traZODone  (DESYREL ) tablet 25 mg (has no administration in time range)  ondansetron  (ZOFRAN ) tablet 4 mg (has no administration in time range)    Or  ondansetron  (ZOFRAN ) injection 4 mg (has no administration in time range)  albuterol  (PROVENTIL ) (2.5 MG/3ML) 0.083% nebulizer solution 2.5 mg (has no administration in time range)  QUEtiapine  (SEROQUEL ) tablet 50 mg (50 mg Oral Given 08/21/24 1135)                                    Medical Decision Making Amount and/or Complexity of Data Reviewed Labs: ordered. Radiology: ordered.  Risk Prescription drug management. Decision regarding hospitalization.   Patient presents as outlined.  At baseline he does have advanced Alzheimer's but has been directable to assist in ADLs.  Patient's wife has noted a significant change over the past week or so.  Will proceed with diagnostic workup to identify potential cause of change in baseline function in the setting of dementia.  EKG reviewed by myself.  There is ischemic appearance but compared to the prior available tracing, this does not appear significantly changed and possibly some normalization.  Chest x-ray reviewed by radiology low lung volumes without other acute finding.  Patient does exhibit agitation consistent with Alzheimer's.  He is picking at things, removing garments and trying  to get out of the bed.  Temporary calm's with interaction but immediately resumes attempts to leave.  Patient placed in soft waist restraint.  Patient's wife reports that she tried Risperdal  at home that have been prescribed for agitation.  She reports that it just actually seemed to make him worse.  She reports she has never seen any improvement and possible worsening with Namenda.  Seroquel  50 mg orally given for sedation.  White count 15.2 H&H 10 and 30.  Troponin 5149.  Patient's wife updated on troponin finding and the implication of NSTEMI from earlier in the week.  EKG appears to be likely an evolving infarct.  We discussed the extent of the patient's dementia which by [facility] neurology was already classified as severe.  We discussed the normal course of Alzheimer's dementia with and without intercurrent medical illnesses.  Patient's wife reports that no one at the [place] had discussed the implications of severe Alzheimer's diagnosis and the course of illness to be anticipated.  After extensive discussion and decision making, patient's wife agrees changing patient from full code to DNR with limited medical care as appropriate.  She does not wish to have him undergo interventional procedures, chest compressions, defibrillation or intubation.  She reports that she is the POA for the patient and will review all of this with the family members.  Anticipates patient's eldest son will come to the hospital to be present.  Has had some improvement with Seroquel .  He is still awake and alert but now more restful and less agitation and picking.  Consult: Reviewed with cardiology regarding management of NSTEMI in setting of evolving likely over 1 to 2 weeks and advanced dementia.  Will start heparin  and obtain echocardiogram.  Cardiology will consult tomorrow to discuss ongoing management with family.  Consult: Reviewed with Dr. Roxane for admission.    Final diagnoses:  Severe late onset Alzheimer's dementia  with agitation (HCC)  NSTEMI (non-ST elevated myocardial infarction) [facility])    ED Discharge Orders     None          Armenta Canning, MD 08/21/24 1517

## 2024-08-22 ENCOUNTER — Inpatient Hospital Stay (HOSPITAL_COMMUNITY)

## 2024-08-22 DIAGNOSIS — I214 Non-ST elevation (NSTEMI) myocardial infarction: Secondary | ICD-10-CM

## 2024-08-22 DIAGNOSIS — N179 Acute kidney failure, unspecified: Secondary | ICD-10-CM | POA: Insufficient documentation

## 2024-08-22 DIAGNOSIS — Z66 Do not resuscitate: Secondary | ICD-10-CM | POA: Insufficient documentation

## 2024-08-22 DIAGNOSIS — E871 Hypo-osmolality and hyponatremia: Secondary | ICD-10-CM | POA: Insufficient documentation

## 2024-08-22 DIAGNOSIS — E872 Acidosis, unspecified: Secondary | ICD-10-CM | POA: Insufficient documentation

## 2024-08-22 DIAGNOSIS — D72829 Elevated white blood cell count, unspecified: Secondary | ICD-10-CM | POA: Insufficient documentation

## 2024-08-22 DIAGNOSIS — I442 Atrioventricular block, complete: Secondary | ICD-10-CM | POA: Insufficient documentation

## 2024-08-22 LAB — ECHOCARDIOGRAM COMPLETE
AR max vel: 1.57 cm2
AV Area VTI: 1.53 cm2
AV Area mean vel: 1.43 cm2
AV Mean grad: 6 mmHg
AV Peak grad: 12.4 mmHg
Ao pk vel: 1.76 m/s
Area-P 1/2: 5.42 cm2
S' Lateral: 4.3 cm

## 2024-08-22 LAB — URINALYSIS, ROUTINE W REFLEX MICROSCOPIC
Bacteria, UA: NONE SEEN
Bilirubin Urine: NEGATIVE
Glucose, UA: NEGATIVE mg/dL
Ketones, ur: NEGATIVE mg/dL
Leukocytes,Ua: NEGATIVE
Nitrite: NEGATIVE
Protein, ur: NEGATIVE mg/dL
Specific Gravity, Urine: 1.019 (ref 1.005–1.030)
pH: 5 (ref 5.0–8.0)

## 2024-08-22 LAB — CBC
HCT: 30.7 % — ABNORMAL LOW (ref 39.0–52.0)
Hemoglobin: 9.9 g/dL — ABNORMAL LOW (ref 13.0–17.0)
MCH: 27.3 pg (ref 26.0–34.0)
MCHC: 32.2 g/dL (ref 30.0–36.0)
MCV: 84.6 fL (ref 80.0–100.0)
Platelets: 290 K/uL (ref 150–400)
RBC: 3.63 MIL/uL — ABNORMAL LOW (ref 4.22–5.81)
RDW: 14.6 % (ref 11.5–15.5)
WBC: 16.2 K/uL — ABNORMAL HIGH (ref 4.0–10.5)
nRBC: 0 % (ref 0.0–0.2)

## 2024-08-22 LAB — BASIC METABOLIC PANEL WITH GFR
Anion gap: 15 (ref 5–15)
BUN: 37 mg/dL — ABNORMAL HIGH (ref 8–23)
CO2: 22 mmol/L (ref 22–32)
Calcium: 9.3 mg/dL (ref 8.9–10.3)
Chloride: 93 mmol/L — ABNORMAL LOW (ref 98–111)
Creatinine, Ser: 1.68 mg/dL — ABNORMAL HIGH (ref 0.61–1.24)
GFR, Estimated: 42 mL/min — ABNORMAL LOW (ref >60)
Glucose, Bld: 173 mg/dL — ABNORMAL HIGH (ref 70–99)
Potassium: 4.2 mmol/L (ref 3.5–5.1)
Sodium: 130 mmol/L — ABNORMAL LOW (ref 135–145)

## 2024-08-22 LAB — TROPONIN T, HIGH SENSITIVITY: Troponin T High Sensitivity: >10000 ng/L (ref 0–19)

## 2024-08-22 LAB — LIPID PANEL
Cholesterol: 141 mg/dL (ref 0–200)
HDL: 48 mg/dL (ref >40)
LDL Cholesterol: 76 mg/dL (ref 0–99)
Total CHOL/HDL Ratio: 2.9 ratio
Triglycerides: 84 mg/dL (ref <150)
VLDL: 17 mg/dL (ref 0–40)

## 2024-08-22 LAB — GLUCOSE, CAPILLARY
Glucose-Capillary: 106 mg/dL — ABNORMAL HIGH (ref 70–99)
Glucose-Capillary: 71 mg/dL (ref 70–99)

## 2024-08-22 LAB — HEPARIN LEVEL (UNFRACTIONATED): Heparin Unfractionated: <0.1 [IU]/mL — ABNORMAL LOW (ref 0.30–0.70)

## 2024-08-22 LAB — LACTIC ACID, PLASMA: Lactic Acid, Venous: >9 mmol/L (ref 0.5–1.9)

## 2024-08-22 MED ORDER — PERFLUTREN LIPID MICROSPHERE
1.0000 mL | INTRAVENOUS | Status: AC | PRN
  Administered 2024-08-22: 3 mL via INTRAVENOUS

## 2024-08-22 MED ORDER — HEPARIN BOLUS VIA INFUSION
2000.0000 [IU] | Freq: Once | INTRAVENOUS | Status: AC
  Administered 2024-08-22: 2000 [IU] via INTRAVENOUS
  Filled 2024-08-22: qty 2000

## 2024-08-22 MED ORDER — NITROGLYCERIN 0.4 MG SL SUBL
0.4000 mg | SUBLINGUAL_TABLET | SUBLINGUAL | Status: DC | PRN
  Filled 2024-08-22: qty 1

## 2024-08-27 LAB — CULTURE, BLOOD (ROUTINE X 2)
Culture: NO GROWTH
Culture: NO GROWTH
Special Requests: ADEQUATE

## 2024-08-27 NOTE — Consult Note (Addendum)
 Cardiology Consultation   Patient ID: Arthur Reed MRN: [id number]; DOB: 06/04/1946  Admit date: 08/21/2024 Date of Consult: 09/10/24  PCP:  Arthur Reed, Arthur GRADE, MD   [facility] Providers Cardiologist:  Dub Huntsman, DO      Patient Profile: Arthur Reed is a 78 y.o. male with a hx of hypertension, hyperlipidemia, type 2 diabetes, CKD stage IIIa and advanced dementia who is being seen 2024-09-10 for the evaluation of NSTEMI at the request of Delon Hoe DO.  History of Present Illness: Mr. Arthur Reed is a 78 year old male who per chart review has was previously seen by cardiology at the [place].  The patient was reportedly seen at the [facility] for worsening dyspnea on exertion and chest pain.  The chest pain was reportedly reproducible on palpation and was felt to be atypical for ACS.  A nuclear stress test was done on 11/09/2023 that showed normal myocardial perfusion, no evidence of myocardial ischemia or infarction, and normal LVEF of 64%.  Patient presented to the emergency department for diarrhea and agitation.  Was hospitalized for concerns of NSTEMI.  The patient received aspirin  325 mg and was started on IV heparin .  Interview was limited by patient confusion and agitation.  Patient was unable to answer any questions.  Spouse reported that he has had confusion, diarrhea, and weakness.  She also reported that he has been complaining of intermittent chest pain for at least the last 8 to 9 months.  Reported that the chest pain is worse with exertion.  Labs showed high-sensitivity troponins 5149 > 5229 > greater than 10,000, hemoglobin A1c of 5.6, creatinine of 1.68, BUN of 37, potassium of 4.2, hyponatremia of 130, glucose of 173, LDL of 76, WBC count of 16.2, anemia with a hemoglobin of 9.9.  EKG showed normal sinus rhythm with heart rate of 88, inferior lateral T wave inversions, ST elevation in leads II, III and aVF and some depression in aVL patient appears  to of had some of these elevations on prior EKG in 2017.  Chest x-ray showed Reticular opacity in the right suprahilar region, likely scarring. Streaky opacities in the left lung base, likely atelectasis, low lung volumes, and aortic atherosclerosis.  Past Medical History:  Diagnosis Date   Dissection of abdominal aorta (HCC), short-segment, chronic, infrarenal 09/29/2015   Dysuria    Full dentures    History of acute pyelonephritis    admission 09-28-2015  resolved   History of kidney stones    History of TB (tuberculosis)    2013 -- per pt treated for 6 months and didn't know if test positive or not-- THIS WAS DONE AT [place] in [facility]   Iliac artery stenosis, right    severe stenosis right common iliac artery -- per CT 09-28-2015   Renal calculi    bilateral non-obstructive per ct and on the right a large calculus   Right flank pain    Type 2 diabetes mellitus (HCC)    Wears glasses     Past Surgical History:  Procedure Laterality Date   CYSTOSCOPY WITH URETEROSCOPY AND STENT PLACEMENT Right 11/07/2015   Procedure: CYSTOSCOPY RIGHT URETEROSCOPY RIGHT STENT REMOVAL POSSIBLE STENT REPLACEMENT   ;  Surgeon: Mark Ottelin, MD;  Location: [facility];  Service: Urology;  Laterality: Right;   EYE SURGERY Bilateral 2014   laser for eye glaucoma   HOLMIUM LASER APPLICATION Right 11/07/2015   Procedure: HOLMIUM LASER APPLICATION;  Surgeon: Oneil Rafter, MD;  Location: [facility];  Service: Urology;  Laterality: Right;   NEPHROLITHOTOMY Right 09/18/2015   Procedure: RIGHT PERCUTANEOUS NEPHROLITHOTOMY ;  Surgeon: Mark Ottelin, MD;  Location: [facility];  Service: Urology;  Laterality: Right;     Home Medications:  Prior to Admission medications   Medication Sig Start Date End Date Taking? Authorizing Provider  acetaminophen  (TYLENOL ) 325 MG tablet Take 975 mg by mouth 2 (two) times daily as needed for mild pain or moderate pain.   Yes [provider]   amLODipine  (NORVASC ) 5 MG tablet Take 5 mg by mouth.   Yes [provider]  aspirin  81 MG chewable tablet Chew 81 mg by mouth daily. 03/20/24  Yes [provider]  chlorthalidone (HYGROTON) 25 MG tablet Take 25 mg by mouth.   Yes [provider]  ergocalciferol (VITAMIN D2) 1.25 MG (50000 UT) capsule Take 50,000 Units by mouth once a week.   Yes [provider]  gabapentin  (NEURONTIN ) 300 MG capsule Take 300-600 mg by mouth 2 (two) times daily. Take one capsule in the morning. Take two capsules in the evening.   Yes [provider]  lisinopril (ZESTRIL) 40 MG tablet Take 40 mg by mouth.   Yes [provider]  melatonin 3 MG TABS tablet Take 3 mg by mouth at bedtime.   Yes [provider]  memantine (NAMENDA) 10 MG tablet Take 10 mg by mouth 2 (two) times daily.   Yes [provider]  metFORMIN (GLUCOPHAGE) 500 MG tablet Take 500 mg by mouth daily with breakfast.   Yes [provider]  mirtazapine  (REMERON ) 15 MG tablet Take 15 mg by mouth.   Yes [provider]  risperiDONE  (RISPERDAL ) 2 MG tablet Take 2 mg by mouth 2 (two) times daily as needed (agitation and anxiety). 05/04/24  Yes [provider]  rosuvastatin  (CRESTOR ) 10 MG tablet Take 10 mg by mouth daily.   Yes [provider]  rosuvastatin  (CRESTOR ) 20 MG tablet Take 1 tablet (20 mg total) by mouth daily. Patient not taking: Reported on 08/21/2024 07/17/24   Arthur Reed, Arthur GRADE, MD    Scheduled Meds:  amLODipine   5 mg Oral Daily   feeding supplement  237 mL Oral BID BM   gabapentin   300 mg Oral q morning   And   gabapentin   600 mg Oral QHS   insulin  aspart  0-15 Units Subcutaneous TID WC   insulin  aspart  0-5 Units Subcutaneous QHS   mirtazapine   15 mg Oral QHS   QUEtiapine   50 mg Oral BID   rosuvastatin   10 mg Oral Daily   Continuous Infusions:  heparin  950 Units/hr (2024/08/26 0441)   PRN Meds: acetaminophen  **OR**  acetaminophen , albuterol , haloperidol  lactate, nitroGLYCERIN , ondansetron  **OR** ondansetron  (ZOFRAN ) IV, perflutren  lipid microspheres (DEFINITY ) IV suspension, risperiDONE , traZODone   Allergies:   No Known Allergies  Social History:   Social History   Socioeconomic History   Marital status: Married    Spouse name: Not on file   Number of children: Not on file   Years of education: Not on file   Highest education level: Not on file  Occupational History   Not on file  Tobacco Use   Smoking status: Never   Smokeless tobacco: Never  Substance and Sexual Activity   Alcohol use: Yes    Alcohol/week: 14.0 - 21.0 standard drinks of alcohol    Types: 7 - 14 Cans of beer, 7 Shots of liquor per week    Comment: beer or 1 drink  nightly   Drug use: No   Sexual activity: Not on file  Other Topics Concern   Not on file  Social History Narrative   Not on file   Social Drivers of Health   Financial Resource Strain: Not on file  Food Insecurity: Not on file  Transportation Needs: Not on file  Physical Activity: Not on file  Stress: Not on file  Social Connections: Not on file  Intimate Partner Violence: Not At Risk (08/16/2023)   Received from [facility]   HITS    Over the last 12 months how often did your partner physically hurt you?: Never    Over the last 12 months how often did your partner insult you or talk down to you?: Never    Over the last 12 months how often did your partner threaten you with physical harm?: Never    Over the last 12 months how often did your partner scream or curse at you?: Never    Family History:   History reviewed. No pertinent family history.   ROS:  Please see the history of present illness.   All other ROS reviewed and negative.     Physical Exam/Data: Vitals:   2024/09/16 0030 09-16-2024 0434 Sep 16, 2024 0849 2024-09-16 0856  BP: 112/69 113/71 (!) 108/53 (!) 96/56  Pulse: 98 98    Resp:      Temp: 97.9 F (36.6 C) 98.2 F (36.8 C)     TempSrc: Axillary Axillary    SpO2: 99% 97%    Weight:      Height:        Intake/Output Summary (Last 24 hours) at September 16, 2024 0905 Last data filed at 16-Sep-2024 0400 Gross per 24 hour  Intake 245.77 ml  Output 200 ml  Net 45.77 ml      08/21/2024    6:59 PM 07/10/2024    1:10 PM 11/07/2015    6:17 AM  Last 3 Weights  Weight (lbs) 141 lb 8.6 oz 142 lb 14.4 oz 149 lb  Weight (kg) 64.2 kg 64.819 kg 67.586 kg     Body mass index is 20.31 kg/m.  General:  Well nourished, well developed, in no acute distress.  The patient has mittens on, is moving uncontrollably, and cannot follow directions. HEENT: normal Neck: no JVD Vascular: No carotid bruits; Distal pulses 2+ bilaterally Cardiac:  normal S1, S2; RRR; no murmur  Lungs:  clear to auscultation bilaterally, no wheezing, rhonchi or rales  Abd: soft, nontender, no hepatomegaly  Ext: no edema Musculoskeletal:  No deformities, BUE and BLE strength normal and equal Skin: warm and dry  Neuro:   no focal abnormalities noted Psych:  Normal affect   EKG:  The EKG was personally reviewed and demonstrates:  sinus rhythm with heart rate of 88, inferior lateral T wave inversions, ST elevation in leads II, III and aVF and some depression in aVL patient appears to of had some of these elevations on prior EKG in 2017. Telemetry:  Telemetry was personally reviewed and demonstrates: Patient was in sinus tachycardia in the 100s overnight.  At about 6:20 AM heart rates decreased to 60s to 70s.  Telemetry appears to show that patient is in complete heart block.  Relevant CV Studies: Echocardiogram pending  Laboratory Data: High Sensitivity Troponin:  No results for input(s): TROPONINIHS in the last 720 hours.   Chemistry Recent Labs  Lab 08/21/24 1319 September 16, 2024 0117  NA 132* 130*  K 4.5 4.2  CL 95* 93*  CO2 27  22  GLUCOSE 142* 173*  BUN 27* 37*  CREATININE 1.42* 1.68*  CALCIUM  9.5 9.3  GFRNONAA 51* 42*  ANIONGAP 9 15    Recent  Labs  Lab 08/21/24 1319  PROT 7.1  ALBUMIN 3.6  AST 271*  ALT 52*  ALKPHOS 76  BILITOT 0.8   Lipids  Recent Labs  Lab 30-Aug-2024 0117  CHOL 141  TRIG 84  HDL 48  LDLCALC 76  CHOLHDL 2.9    Hematology Recent Labs  Lab 08/21/24 1042 Aug 30, 2024 0117  WBC 15.2* 16.2*  RBC 3.58* 3.63*  HGB 10.0* 9.9*  HCT 30.2* 30.7*  MCV 84.4 84.6  MCH 27.9 27.3  MCHC 33.1 32.2  RDW 14.9 14.6  PLT 306 290   Thyroid No results for input(s): TSH, FREET4 in the last 168 hours.  BNPNo results for input(s): BNP, PROBNP in the last 168 hours.  DDimer No results for input(s): DDIMER in the last 168 hours.  Radiology/Studies:  DG Chest Port 1 View Result Date: 08/21/2024 CLINICAL DATA:  alt ms EXAM: PORTABLE CHEST - 1 VIEW COMPARISON:  March 28, 2012 FINDINGS: Low lung volumes. Reticular opacity in the right suprahilar region, likely scarring. Streaky opacities in the left lung base, likely atelectasis no focal airspace consolidation, pleural effusion, or pneumothorax. The cardiac silhouette is at the upper limits of normal, likely accentuated by AP technique and low lung volumes. Aortic atherosclerosis. No acute fracture or destructive lesions. Multilevel thoracic osteophytosis. IMPRESSION: Low lung volumes.  Otherwise, no acute cardiopulmonary abnormality. Electronically Signed   By: Rogelia Myers M.D.   On: 08/21/2024 12:13     Assessment and Plan: Arthur Reed is a 78 y.o. male with a hx of hypertension, hyperlipidemia, type 2 diabetes, CKD stage IIIa and advanced dementia who is being seen 08-30-2024 for the evaluation of NSTEMI at the request of Delon Hoe DO.  NSTEMI Hyperlipidemia Per chart review patient was hospitalized for diarrhea and agitation. The patient was reportedly seen at the [facility] for worsening dyspnea on exertion and chest pain.  The chest pain was reportedly reproducible on palpation and was felt to be atypical for ACS.  A nuclear stress test was done on  11/09/2023 that showed normal myocardial perfusion, no evidence of myocardial ischemia or infarction, and normal LVEF of 64%. Labs showed high-sensitivity troponins 5149 > 5229 > greater than 10,000, EKG showed normal sinus rhythm with heart rate of 88, inferior lateral T wave inversions, ST elevation in leads II, III and aVF and some depression in aVL patient appears to of had some of these elevations on prior EKG in 2017. I ordered a repeat EKG for this morning that showed complete heart block with a rate of 63, wide QRS, ST elevation in leads III and aVF that appears similar to elevation yesterday.  Also has ST depression in 1 and aVL and Inferior T wave inversions. Continue IV heparin  Continue aspirin  81 mg daily Echo pending. Will avoid starting statin for now given that patient has agitation. Order lactic acid. Will avoid nitroglycerin  for now given EKG changes are in the inferior leads. Discussed with patient's spouse and she is agreeable to do a cardiac catheterization.  Discussed with Dr. Sheena and there are concerns about sending the patient for a cardiac catheterization given that he cannot follow directions.   Complete heart block Patient was in sinus tachycardia in the 100s overnight.  At about 6:20 AM heart rates decreased to 60s to 70s.  Telemetry appears to show that patient  is in complete heart block. Stop metoprolol  due to concerns of heart block. Ordered EKG that confirmed patient was in complete heart block. Will discuss patient with EP but suspect that is a poor candidate for a pacemaker.   CKD stage IIIa AKI Creatinine this a.m. is 1.68.  Patient's baseline creatinine appears to be about 1.1   Advanced dementia Interview is limited by patient's confusion. Management per primary   Type 2 diabetes Hemoglobin A1c is 5.6. Management per primary   Leukocytosis Patient has leukocytosis with a WBC count of 16.2.  Patient was reportedly hospitalized for diarrhea and  agitation.  Question whether patient also has some underlying infection.  May consider ordering procalcitonin and UA. Management per primary   Otherwise management per primary    Risk Assessment/Risk Scores:    TIMI Risk Score for Unstable Angina or Non-ST Elevation MI:   The patient's TIMI risk score is 3, which indicates a 13% risk of all cause mortality, new or recurrent myocardial infarction or need for urgent revascularization in the next 14 days.         For questions or updates, please contact [facility] Please consult [email or web address] for contact info under   Signed, Morse Clause, PA-C  September 16, 2024 9:05 AM  Patient seen and examined, note reviewed with the signed Advanced Practice Provider. I personally reviewed laboratory data, imaging studies and relevant notes. I independently examined the patient and formulated the important aspects of the plan. I have personally discussed the plan with the patient and/or family. Comments or changes to the note/plan are indicated below.  Upon my arrival to evaluate the patient and discussed potential plan with his wife.  The patient was already deceased.   Ideally from a cardiovascular standpoint he had NSTEMI with noted heart block.  With clinical picture of Severe dementia/advanced, significant agitation proceeding to invasive procedure/cardiac catheterization which is arterial would have been at great risk for the patient.  He was DNI DNR.    Family had no other specific questions.  Rockelle Heuerman DO, MS [facility] Attending Cardiologist [facility]  [address] [place], [place] [address] [phone] Website: [email or web address]

## 2024-08-27 NOTE — Progress Notes (Signed)
 PHARMACY - ANTICOAGULATION CONSULT NOTE  Pharmacy Consult for heparin   Indication: chest pain/ACS  No Known Allergies  Patient Measurements: Height: 5' 10 (177.8 cm) (estimated by family) Weight: 64.2 kg (141 lb 8.6 oz) IBW/kg (Calculated) : 73 HEPARIN  DW (KG): 64.2  Vital Signs: Temp: 97.9 F (36.6 C) 09-06-2024 0030) Temp Source: Axillary 09-06-24 0030) BP: 112/69 09-06-24 0030) Pulse Rate: 98 06-Sep-2024 0030)  Labs: Recent Labs    08/21/24 1042 08/21/24 1319 09-06-2024 0117  HGB 10.0*  --  9.9*  HCT 30.2*  --  30.7*  PLT 306  --  290  HEPARINUNFRC  --   --  <0.10*  CREATININE  --  1.42* 1.68*    Estimated Creatinine Clearance: 33.4 mL/min (A) (by C-G formula based on SCr of 1.68 mg/dL (H)).   Medical History: Past Medical History:  Diagnosis Date   Dissection of abdominal aorta (HCC), short-segment, chronic, infrarenal 09/29/2015   Dysuria    Full dentures    History of acute pyelonephritis    admission 09-28-2015  resolved   History of kidney stones    History of TB (tuberculosis)    2013 -- per pt treated for 6 months and didn't know if test positive or not-- THIS WAS DONE AT [place] in [facility]   Iliac artery stenosis, right    severe stenosis right common iliac artery -- per CT 09-28-2015   Renal calculi    bilateral non-obstructive per ct and on the right a large calculus   Right flank pain    Type 2 diabetes mellitus (HCC)    Wears glasses      Assessment: Patient is a 78yo M admitted with chest pain for ~2weeks. Trop elevated (~5k) on admit.  Patient is not on chronic anticoagulation at home. Pharmacy consulted for heparin  management for ACS.   2024/09/06 Heparin  level < 0.1, subtherapeutic on 750 units/hr Hgb 9.9, plts 290 No bleeding reported  Goal of Therapy:  Heparin  level 0.3-0.7 units/ml Monitor platelets by anticoagulation protocol: Yes   Plan:  Heparin  bolus 2000 units IV x1 Increase  heparin  infusion to 950 units/hr Check heparin  level in 8 hrs   Monitor CBC daily  Monitor signs and symptoms of bleeding   Leeroy Mace RPh 09/06/24, 3:47 AM

## 2024-08-27 NOTE — Death Summary Note (Signed)
 DEATH SUMMARY   Patient Details  Name: Arthur Reed MRN: [id number] DOB: Nov 20, 1945 ERE:Yzmwjwizs Delma Tully GRADE, MD Admission/Discharge Information   Admit Date:  08-28-2024  Date of Death: Date of Death: Aug 29, 2024  Time of Death: Time of Death: 1057  Length of Stay: 1   Principle Cause of death: NSTEMI   Hospital Diagnoses: Principal Problem:   NSTEMI (non-ST elevated myocardial infarction) [facility]) Active Problems:   Type 2 diabetes mellitus (HCC)   Hypertension associated with diabetes (HCC)   Hyperlipidemia associated with type 2 diabetes mellitus (HCC)   Severe late onset Alzheimer's dementia without behavioral disturbance, psychotic disturbance, mood disturbance, or anxiety (HCC)   CKD stage 3a, GFR 45-59 ml/min (HCC)   AKI (acute kidney injury)   Leukocytosis   Lactic acidosis   Hyponatremia   Complete heart block (HCC)   DNR (do not resuscitate)   Hospital Course: Arthur Reed is a 78 y.o. male with medical history significant for non-insulin -dependent type 2 diabetes, advanced Alzheimer's dementia who lives at home with his wife and is being admitted to the hospital with NSTEMI.  She states that he is somewhat disoriented and sundown's every night, however for the last 3 days he has been increasingly agitated and difficult to reorient, has not been sleeping well.  For the last couple weeks he has been intermittently complaining of chest pain, he was evaluated at the [facility] and nothing specific was found.  On evaluation here in the emergency department, he remains quite agitated, difficult to be oriented and was found to have significant troponin elevation without obvious acute EKG changes.  ER provider had extensive discussion with cardiology as well as with the patient's wife, who states that she would like the patient to be DNR/DNI and does not want any invasive procedures for him.  As such, plan is for medical therapy, he was started on IV heparin  drip and  hospitalist admission was requested.   Patient's troponin continued to trend upward to >10,000.  Lactic acid >9 with leukocytosis 16.2, creatinine 1.68.  Cardiology consulted.  There was discussion regarding cardiac catheterization, although patient remained at high risk for transfer to facility as well as the fact of patient not following directions due to advanced dementia.  Ultimately, prior to further decision regarding transfer for cardiac cath, patient declined and was pronounced deceased 08-29-24.  Family at bedside.  Consultations: Cardiology   The results of significant diagnostics from this hospitalization (including imaging, microbiology, ancillary and laboratory) are listed below for reference.   Significant Diagnostic Studies: ECHOCARDIOGRAM COMPLETE Result Date: 29-Aug-2024    ECHOCARDIOGRAM REPORT   Patient Name:   Arthur Reed Date of Exam: August 29, 2024 Medical Rec #:  [id number]          Height:       70.0 in Accession #:    [id number]         Weight:       141.5 lb Date of Birth:  29-Dec-1945          BSA:          1.802 m Patient Age:    78 years           BP:           113/71 mmHg Patient Gender: M                  HR:           54 bpm. Exam Location:  Inpatient Procedure: 2D Echo, Cardiac  Doppler, Color Doppler and Intracardiac            Opacification Agent (Both Spectral and Color Flow Doppler were            utilized during procedure). Indications:    NSTEMI  History:        Patient has no prior history of Echocardiogram examinations.                 Risk Factors:Hypertension, Diabetes and Dyslipidemia. CKD.  Sonographer:    Philomena Daring Referring Phys: [id number] MARCY PFEIFFER IMPRESSIONS  1. Left ventricular ejection fraction, by estimation, is 30 to 35%. The left ventricle has moderately decreased function. The left ventricle demonstrates regional wall motion abnormalities (see scoring diagram/findings for description). There is mild left ventricular hypertrophy. Left ventricular  diastolic parameters are indeterminate.  2. Right ventricular systolic function is severely reduced. The right ventricular size is normal. There is normal pulmonary artery systolic pressure. The estimated right ventricular systolic pressure is 24.6 mmHg.  3. The mitral valve is normal in structure. Trivial mitral valve regurgitation. No evidence of mitral stenosis.  4. The aortic valve was not well visualized. Aortic valve regurgitation is not visualized. Aortic valve sclerosis/calcification is present, without any evidence of aortic stenosis.  5. The inferior vena cava is dilated in size with >50% respiratory variability, suggesting right atrial pressure of 8 mmHg. FINDINGS  Left Ventricle: Left ventricular ejection fraction, by estimation, is 30 to 35%. The left ventricle has moderately decreased function. The left ventricle demonstrates regional wall motion abnormalities. Definity  contrast agent was given IV to delineate the left ventricular endocardial borders. The left ventricular internal cavity size was normal in size. There is mild left ventricular hypertrophy. Left ventricular diastolic parameters are indeterminate.  LV Wall Scoring: The entire inferior wall, mid inferoseptal segment, and apical septal segment are akinetic. The mid anteroseptal segment, mid inferolateral segment, basal inferoseptal segment, and apex are hypokinetic. The entire anterior wall, antero-lateral wall, basal anteroseptal segment, basal inferolateral segment, and apical lateral segment are normal. Right Ventricle: The right ventricular size is normal. Right vetricular wall thickness was not well visualized. Right ventricular systolic function is severely reduced. There is normal pulmonary artery systolic pressure. The tricuspid regurgitant velocity is 2.04 m/s, and with an assumed right atrial pressure of 8 mmHg, the estimated right ventricular systolic pressure is 24.6 mmHg. Left Atrium: Left atrial size was normal in size. Right  Atrium: Right atrial size was normal in size. Pericardium: There is no evidence of pericardial effusion. Mitral Valve: The mitral valve is normal in structure. Trivial mitral valve regurgitation. No evidence of mitral valve stenosis. Tricuspid Valve: The tricuspid valve is normal in structure. Tricuspid valve regurgitation is mild. Aortic Valve: The aortic valve was not well visualized. Aortic valve regurgitation is not visualized. Aortic valve sclerosis/calcification is present, without any evidence of aortic stenosis. Aortic valve mean gradient measures 6.0 mmHg. Aortic valve peak gradient measures 12.4 mmHg. Aortic valve area, by VTI measures 1.53 cm. Pulmonic Valve: The pulmonic valve was not well visualized. Pulmonic valve regurgitation is trivial. Aorta: The aortic root is normal in size and structure. Venous: The inferior vena cava is dilated in size with greater than 50% respiratory variability, suggesting right atrial pressure of 8 mmHg. IAS/Shunts: The interatrial septum was not well visualized.  LEFT VENTRICLE PLAX 2D LVIDd:         4.70 cm   Diastology LVIDs:         4.30 cm  LV e' medial:    4.29 cm/s LV PW:         1.00 cm   LV E/e' medial:  14.2 LV IVS:        1.10 cm   LV e' lateral:   8.06 cm/s LVOT diam:     2.10 cm   LV E/e' lateral: 7.5 LV SV:         37 LV SV Index:   21 LVOT Area:     3.46 cm  RIGHT VENTRICLE            IVC RV Basal diam:  3.60 cm    IVC diam: 2.00 cm RV Mid diam:    3.20 cm RV S prime:     6.31 cm/s TAPSE (M-mode): 1.2 cm LEFT ATRIUM             Index        RIGHT ATRIUM           Index LA diam:        2.70 cm 1.50 cm/m   RA Area:     17.40 cm LA Vol (A2C):   43.6 ml 24.19 ml/m  RA Volume:   47.20 ml  26.19 ml/m LA Vol (A4C):   41.3 ml 22.92 ml/m LA Biplane Vol: 43.9 ml 24.36 ml/m  AORTIC VALVE AV Area (Vmax):    1.57 cm AV Area (Vmean):   1.43 cm AV Area (VTI):     1.53 cm AV Vmax:           176.00 cm/s AV Vmean:          114.000 cm/s AV VTI:            0.243 m AV  Peak Grad:      12.4 mmHg AV Mean Grad:      6.0 mmHg LVOT Vmax:         79.70 cm/s LVOT Vmean:        47.100 cm/s LVOT VTI:          0.107 m LVOT/AV VTI ratio: 0.44  AORTA Ao Root diam: 3.00 cm MITRAL VALVE               TRICUSPID VALVE MV Area (PHT): 5.42 cm    TR Peak grad:   16.6 mmHg MV Decel Time: 140 msec    TR Vmax:        204.00 cm/s MV E velocity: 60.80 cm/s MV A velocity: 42.40 cm/s  SHUNTS MV E/A ratio:  1.43        Systemic VTI:  0.11 m                            Systemic Diam: 2.10 cm Lonni Nanas MD Electronically signed by Lonni Nanas MD Signature Date/Time: 09-18-2024/11:11:38 AM    Final    DG Chest Port 1 View Result Date: 08/21/2024 CLINICAL DATA:  alt ms EXAM: PORTABLE CHEST - 1 VIEW COMPARISON:  March 28, 2012 FINDINGS: Low lung volumes. Reticular opacity in the right suprahilar region, likely scarring. Streaky opacities in the left lung base, likely atelectasis no focal airspace consolidation, pleural effusion, or pneumothorax. The cardiac silhouette is at the upper limits of normal, likely accentuated by AP technique and low lung volumes. Aortic atherosclerosis. No acute fracture or destructive lesions. Multilevel thoracic osteophytosis. IMPRESSION: Low lung volumes.  Otherwise, no acute cardiopulmonary abnormality. Electronically Signed   By: Rogelia Carlean HERO.D.  On: 08/21/2024 12:13    Microbiology: No results found for this or any previous visit (from the past 240 hours).  Time spent: 40 minutes  Signed: Delon Hoe, DO 09/20/2024

## 2024-08-27 NOTE — Significant Event (Signed)
 Rapid Response Event Note   Reason for Call :  Possible STEMI  Initial Focused Assessment:  Patient alert but not oriented, this is baseline per wife. Patient appears agitated which is also baseline per wife. Wife told this RN that at times his agitation is much worse at home. Patients HR normal and regular with some ST elevation, s1 & s2. Respiration 35, sp02 94 Room air, lung sounds clear. Cardiology PA at bedside, assessing EKG and informing wife of possible options.   Interventions:  EKG= Possible STEMI, Cardiology PA notified   Plan of Care:  Possible transfer to [facility] for cardiac intervention pending Cardiology recommendation.   Event Summary:  Patient currently on Heparin  gtt, no further intervention needed per cardiology. MD Notified: Delon Hoe DO Call Time: 0830 Arrival Time: 9164 End Time: 0900  Omega LILLETTE Settles, RN

## 2024-08-27 NOTE — Plan of Care (Signed)
  Problem: Safety: Goal: Non-violent Restraint(s) Outcome: Progressing   Problem: Activity: Goal: Risk for activity intolerance will decrease Outcome: Progressing

## 2024-08-27 NOTE — Progress Notes (Signed)
 EKG obtained per cardiology orders, results alerted critical STEMI. CN made aware. This RN paged rapid response, attending hospitalist, and cardiology PA to the bedside at this time.

## 2024-08-27 NOTE — Progress Notes (Signed)
   12-Sep-2024 1105  Spiritual Encounters  Type of Visit Initial  Care provided to: Pt and family  Conversation partners present during encounter Physician;Nurse  Referral source Nurse (RN/NT/LPN)  Reason for visit Patient death  OnCall Visit No  Interventions  Spiritual Care Interventions Made Established relationship of care and support;Compassionate presence;Reflective listening;Normalization of emotions;Bereavement/grief support;Prayer;Supported grief process;Other (comment) (Facilitated conversation bewteen family and medical team)  Intervention Outcomes  Outcomes Connection to spiritual care;Awareness around self/spiritual resourses;Reduced isolation;Reduced anxiety;Patient family open to resources    Chaplain responded to unit page request for family support. Provided in all the ways included above, including emotional support for shock and anger in response to the situation as a normal part of grieving. Finally, I connected Arthur Reed to her priest, Fr. Raguel of [employer] in [place].  Chaplains remain available as needs arise.

## 2024-08-27 NOTE — Progress Notes (Signed)
 Patient bag of belongings left in room by family. Spouse notified by unit secretary. Belongings labeled and placed in conference room.

## 2024-08-27 NOTE — Progress Notes (Signed)
 This RN made aware by sitter/NT that patients respirations started to decrease. At 1054 central telemetry called alerting that patients heart rate was dropping and currently in the 30s. This RN immediately checked on patient to already find CN and another unit nurse checking patients pulse and obtaining vital signs. At the time of arrival at about 1054, patients heart rate down to the 20s. Pulse felt very faint, no heart sounds on assessment. Delon Hoe, DO paged at 1055. Rapid response arrived at the bedside at this time. Time of death verified by this RN, CN and rapid response at 1057. Delon Hoe, DO arrived to the bedside at this time. Chaplain paged and staff provided family emotional support.

## 2024-08-27 DEATH — deceased

## 2024-09-27 NOTE — Accreditation Note (Signed)
 Death in more than 2 point soft restraints reported to CMS 09/04/2024 at 2045 by Valentin Lai RN.
